# Patient Record
Sex: Female | Born: 1985 | Race: White | Hispanic: Yes | Marital: Married | Smoking: Never smoker
Health system: Southern US, Community
[De-identification: ages and names within clinical notes are randomized; demographics above are authoritative.]

## PROBLEM LIST (undated history)

## (undated) ENCOUNTER — Inpatient Hospital Stay (HOSPITAL_COMMUNITY): Payer: Self-pay

## (undated) DIAGNOSIS — Z789 Other specified health status: Secondary | ICD-10-CM

## (undated) DIAGNOSIS — I959 Hypotension, unspecified: Secondary | ICD-10-CM

## (undated) DIAGNOSIS — O2340 Unspecified infection of urinary tract in pregnancy, unspecified trimester: Secondary | ICD-10-CM

## (undated) HISTORY — PX: INTRAUTERINE DEVICE INSERTION: SHX323

## (undated) HISTORY — PX: NO PAST SURGERIES: SHX2092

---

## 2013-02-07 ENCOUNTER — Inpatient Hospital Stay (HOSPITAL_COMMUNITY)
Admission: AD | Admit: 2013-02-07 | Discharge: 2013-02-07 | Disposition: A | Discharge status: Home / Self Care | Payer: Federal, State, Local not specified - PPO | Source: Ambulatory Visit | Attending: Obstetrics & Gynecology | Admitting: Obstetrics & Gynecology

## 2013-02-07 DIAGNOSIS — N939 Abnormal uterine and vaginal bleeding, unspecified: Secondary | ICD-10-CM

## 2013-02-07 DIAGNOSIS — Z3202 Encounter for pregnancy test, result negative: Secondary | ICD-10-CM | POA: Insufficient documentation

## 2013-02-07 DIAGNOSIS — N898 Other specified noninflammatory disorders of vagina: Secondary | ICD-10-CM | POA: Insufficient documentation

## 2013-02-07 LAB — URINALYSIS, ROUTINE W REFLEX MICROSCOPIC
Bilirubin Urine: NEGATIVE
Glucose, UA: NEGATIVE mg/dL
Ketones, ur: NEGATIVE mg/dL
Protein, ur: NEGATIVE mg/dL
Urobilinogen, UA: 0.2 mg/dL (ref 0.0–1.0)

## 2013-02-07 LAB — CBC
HCT: 37.3 % (ref 36.0–46.0)
Hemoglobin: 12.6 g/dL (ref 12.0–15.0)
RBC: 4.6 MIL/uL (ref 3.87–5.11)
WBC: 5.7 10*3/uL (ref 4.0–10.5)

## 2013-02-07 LAB — URINE MICROSCOPIC-ADD ON

## 2013-02-07 LAB — WET PREP, GENITAL
Clue Cells Wet Prep HPF POC: NONE SEEN
Trich, Wet Prep: NONE SEEN
Yeast Wet Prep HPF POC: NONE SEEN

## 2013-02-07 NOTE — MAU Note (Signed)
Pt stated had a positive pregnancy test four weeks ago.  Noticed small amount of old blood on Friday and took another pregnancy test that was negative.  Today states that the bleeding is heavier and brighter and that she has had cramping as well.

## 2013-02-07 NOTE — MAU Provider Note (Signed)
History     CSN: [id number]  Arrival date and time: 02/07/13 1052   First Provider Initiated Contact with Patient 02/07/13 1217      Chief Complaint  Patient presents with  . Vaginal Bleeding   HPI  [name] is a 27 y.o. female G59P0 non-pregnant female who presents for vaginal bleeding and possible miscarriage. The patient took two pregnancy tests four weeks ago and then started bleeding three weeks ago and it stopped. She started bleeding again this past Friday and took a pregnancy test at that time which was negative. LMP was two months ago; menstrual cycles are irregular.  Pt is currently bleeding, says she has worn one panty liner today. She had a miscarraige in the past and it was a similar situation; positive pregnancy test at home, never seen by a Dr.  She currently rates her pain 2/10.    OB History   No data available      No past medical history on file.  No past surgical history on file.  No family history on file.  History  Substance Use Topics  . Smoking status: Not on file  . Smokeless tobacco: Not on file  . Alcohol Use: Not on file    Allergies: No Known Allergies  Prescriptions prior to admission  Medication Sig Dispense Refill  . Docosahexaenoic Acid (PRENATAL DHA PO) Take 1 tablet by mouth daily.       Results for orders placed during the hospital encounter of 02/07/13 (from the past 24 hour(s))  URINALYSIS, ROUTINE W REFLEX MICROSCOPIC     Status: Abnormal   Collection Time    02/07/13 11:05 AM      Result Value Range   Color, Urine YELLOW  YELLOW   APPearance CLEAR  CLEAR   Specific Gravity, Urine <1.005 (*) 1.005 - 1.030   pH 6.0  5.0 - 8.0   Glucose, UA NEGATIVE  NEGATIVE mg/dL   Hgb urine dipstick LARGE (*) NEGATIVE   Bilirubin Urine NEGATIVE  NEGATIVE   Ketones, ur NEGATIVE  NEGATIVE mg/dL   Protein, ur NEGATIVE  NEGATIVE mg/dL   Urobilinogen, UA 0.2  0.0 - 1.0 mg/dL   Nitrite NEGATIVE  NEGATIVE   Leukocytes, UA NEGATIVE   NEGATIVE  URINE MICROSCOPIC-ADD ON     Status: None   Collection Time    02/07/13 11:05 AM      Result Value Range   Squamous Epithelial / LPF RARE  RARE   RBC / HPF TOO NUMEROUS TO COUNT  <3 RBC/hpf   Bacteria, UA RARE  RARE   Urine-Other MUCOUS PRESENT    HCG, QUANTITATIVE, PREGNANCY     Status: None   Collection Time    02/07/13 11:29 AM      Result Value Range   hCG, Beta Chain, Quant, S <1  <5 mIU/mL  CBC     Status: None   Collection Time    02/07/13 11:29 AM      Result Value Range   WBC 5.7  4.0 - 10.5 K/uL   RBC 4.60  3.87 - 5.11 MIL/uL   Hemoglobin 12.6  12.0 - 15.0 g/dL   HCT 78.2  95.6 - 21.3 %   MCV 81.1  78.0 - 100.0 fL   MCH 27.4  26.0 - 34.0 pg   MCHC 33.8  30.0 - 36.0 g/dL   RDW 08.6  57.8 - 46.9 %   Platelets 285  150 - 400 K/uL  WET PREP, GENITAL  Status: Abnormal   Collection Time    02/07/13 12:39 PM      Result Value Range   Yeast Wet Prep HPF POC NONE SEEN  NONE SEEN   Trich, Wet Prep NONE SEEN  NONE SEEN   Clue Cells Wet Prep HPF POC NONE SEEN  NONE SEEN   WBC, Wet Prep HPF POC FEW (*) NONE SEEN      Review of Systems  Constitutional: Positive for chills.  Gastrointestinal: Positive for nausea and abdominal pain. Negative for vomiting, diarrhea and constipation.  Genitourinary: Negative for dysuria, urgency, frequency and hematuria.       No vaginal discharge. + vaginal bleeding. No dysuria.    Physical Exam   Blood pressure 125/69, pulse 74, temperature 98.8 F (37.1 C), temperature source Oral, resp. rate 18, height 5\' 6"  (1.676 m).  Physical Exam  Constitutional: She is oriented to person, place, and time. She appears well-developed and well-nourished. No distress.  Eyes: Pupils are equal, round, and reactive to light.  Neck: Normal range of motion.  Respiratory: Effort normal.  GI: Soft. She exhibits no distension. There is no tenderness. There is no rebound and no guarding.  Genitourinary:  Speculum exam: Vagina - Small  amount of dark red vaginal blood in the vaginal canal. no odor Cervix - Moderate contact bleeding; I used two fox swabs to clear the blood from the vagina in order to visualize the cervix.  Bimanual exam: Cervix closed Uterus non tender, normal size Adnexa non tender, no masses bilaterally GC/Chlam, wet prep done Chaperone present for exam.   Neurological: She is alert and oriented to person, place, and time.  Skin: Skin is warm and dry. She is not diaphoretic.    MAU Course  Procedures None  MDM UA UPT Wet prep GC/Chlamydia Beta Hcg level.   Assessment and Plan   A: 1. Abnormal vaginal bleeding   2. Encounter for pregnancy test with result negative    P: Discharge home This bleeding is likely your menstrual cycle; if you bleed longer than 10 days return to MAU or call the HD to schedule a follow up appointment Bleeding precautions discussed Ok to take ibuprofen as needed, as directed on the bottle.  Support given  Venia Carbon IRENE FNP-C 02/07/2013, 5:47 PM

## 2013-02-07 NOTE — MAU Provider Note (Signed)
Attestation of Attending Supervision of Advanced Practitioner (CNM/NP): Evaluation and management procedures were performed by the Advanced Practitioner under my supervision and collaboration.  I have reviewed the Advanced Practitioner's note and chart, and I agree with the management and plan.  HARRAWAY-SMITH, Freeman Borba 11:05 PM     

## 2013-02-08 LAB — GC/CHLAMYDIA PROBE AMP
CT Probe RNA: NEGATIVE
GC Probe RNA: NEGATIVE

## 2013-04-08 NOTE — L&D Delivery Note (Signed)
Delivery Note At 10:29 PM a viable female was delivered via  (Presentation: ;  ).  APGAR: , ; weight .   Placenta status: , .  Cord:  with the following complications: .  Cord pH: not done  Anesthesia:   Episiotomy:  Lacerations:  Suture Repair: 2.0 vicryl Est. Blood Loss (mL):   Mom to postpartum.  Baby to Couplet care / Skin to Skin.  MARSHALL,BERNARD A 12/09/2013, 10:45 PM

## 2013-06-22 ENCOUNTER — Encounter (HOSPITAL_COMMUNITY): Payer: Self-pay | Admitting: *Deleted

## 2013-06-22 ENCOUNTER — Inpatient Hospital Stay (HOSPITAL_COMMUNITY)
Admission: AD | Admit: 2013-06-22 | Discharge: 2013-06-22 | Disposition: A | Discharge status: Home / Self Care | Payer: Medicaid Other | Source: Ambulatory Visit | Attending: Family Medicine | Admitting: Family Medicine

## 2013-06-22 DIAGNOSIS — O0930 Supervision of pregnancy with insufficient antenatal care, unspecified trimester: Secondary | ICD-10-CM | POA: Insufficient documentation

## 2013-06-22 DIAGNOSIS — R42 Dizziness and giddiness: Secondary | ICD-10-CM | POA: Insufficient documentation

## 2013-06-22 DIAGNOSIS — O9989 Other specified diseases and conditions complicating pregnancy, childbirth and the puerperium: Principal | ICD-10-CM

## 2013-06-22 DIAGNOSIS — O99891 Other specified diseases and conditions complicating pregnancy: Secondary | ICD-10-CM | POA: Insufficient documentation

## 2013-06-22 HISTORY — DX: Hypotension, unspecified: I95.9

## 2013-06-22 LAB — COMPREHENSIVE METABOLIC PANEL
ALT: 13 U/L (ref 0–35)
AST: 13 U/L (ref 0–37)
Albumin: 2.8 g/dL — ABNORMAL LOW (ref 3.5–5.2)
Alkaline Phosphatase: 49 U/L (ref 39–117)
BUN: 6 mg/dL (ref 6–23)
CO2: 23 mEq/L (ref 19–32)
Calcium: 9 mg/dL (ref 8.4–10.5)
Chloride: 104 mEq/L (ref 96–112)
Creatinine, Ser: 0.47 mg/dL — ABNORMAL LOW (ref 0.50–1.10)
GFR calc Af Amer: >90 mL/min (ref >90)
GFR calc non Af Amer: >90 mL/min (ref >90)
GLUCOSE: 101 mg/dL — AB (ref 70–99)
POTASSIUM: 4.2 meq/L (ref 3.7–5.3)
SODIUM: 138 meq/L (ref 137–147)
TOTAL PROTEIN: 6.3 g/dL (ref 6.0–8.3)
Total Bilirubin: <0.2 mg/dL — ABNORMAL LOW (ref 0.3–1.2)

## 2013-06-22 LAB — URINALYSIS, ROUTINE W REFLEX MICROSCOPIC
Bilirubin Urine: NEGATIVE
GLUCOSE, UA: NEGATIVE mg/dL
Hgb urine dipstick: NEGATIVE
Ketones, ur: NEGATIVE mg/dL
LEUKOCYTES UA: NEGATIVE
Nitrite: NEGATIVE
PH: 6.5 (ref 5.0–8.0)
Protein, ur: NEGATIVE mg/dL
SPECIFIC GRAVITY, URINE: 1.025 (ref 1.005–1.030)
Urobilinogen, UA: 0.2 mg/dL (ref 0.0–1.0)

## 2013-06-22 LAB — CBC
HCT: 34.3 % — ABNORMAL LOW (ref 36.0–46.0)
HEMOGLOBIN: 11.8 g/dL — AB (ref 12.0–15.0)
MCH: 29 pg (ref 26.0–34.0)
MCHC: 34.4 g/dL (ref 30.0–36.0)
MCV: 84.3 fL (ref 78.0–100.0)
Platelets: 223 10*3/uL (ref 150–400)
RBC: 4.07 MIL/uL (ref 3.87–5.11)
RDW: 13.5 % (ref 11.5–15.5)
WBC: 7.8 10*3/uL (ref 4.0–10.5)

## 2013-06-22 LAB — POCT PREGNANCY, URINE: PREG TEST UR: POSITIVE — AB

## 2013-06-22 NOTE — Progress Notes (Signed)
Vieno Tarrant was placed in [place] in error. There is a fetal monitor tracing in this medical record that belongs to Rocky Morel MRN [id number].

## 2013-06-22 NOTE — Discharge Instructions (Signed)

## 2013-06-22 NOTE — MAU Provider Note (Signed)
Attestation of Attending Supervision of Advanced Practitioner (PA/CNM/NP): Evaluation and management procedures were performed by the Advanced Practitioner under my supervision and collaboration.  I have reviewed the Advanced Practitioner's note and chart, and I agree with the management and plan.  Donnamae Jude, MD [facility] Attending 06/22/2013 1:37 PM

## 2013-06-22 NOTE — MAU Provider Note (Signed)
History     CSN: [id number]  Arrival date and time: 06/22/13 0935   None     Chief Complaint  Patient presents with  . Possible Pregnancy  . Dizziness   HPI Pt is a 28 yo G3P0020 currently pregnant female w/ a PMH of miscarriage and hypotension presenting with a recent episode of dizziness (pt feels like the room is spinning) . Pt states that she has a long-standing hx of hypotension which causes constant lightheadedness and fatigue. Pt states that yesterday while sitting at her office, she began to experience an episode of the room spinning. Other associated symptoms with this was nausea but no vomiting. She did not experience a LOC. Pt stated that she thought she was going to experience another episode at work today, so decided to come over for a check-up. The patient has a high stress job and recently had an increase in the number of hours she has to work.  Pt stays adequately hydrated. She otherwise feels at her baseline with no new symptoms.  Pt states that she thinks she may have experienced her baby "flutter" for the first time on Saturday. She has had no contractions, and is experiencing no bleeding or fluid leakage.   Past Medical History  Diagnosis Date  . Hypotension     History reviewed. No pertinent past surgical history.  History reviewed. No pertinent family history.  History  Substance Use Topics  . Smoking status: Never Smoker   . Smokeless tobacco: Never Used  . Alcohol Use: No    Allergies: No Known Allergies  Prescriptions prior to admission  Medication Sig Dispense Refill  . Docosahexaenoic Acid (PRENATAL DHA PO) Take 1 tablet by mouth daily.       Results for orders placed during the hospital encounter of 06/22/13 (from the past 48 hour(s))  URINALYSIS, ROUTINE W REFLEX MICROSCOPIC     Status: None   Collection Time    06/22/13  9:45 AM      Result Value Ref Range   Color, Urine YELLOW  YELLOW   APPearance CLEAR  CLEAR   Specific Gravity, Urine  1.025  1.005 - 1.030   pH 6.5  5.0 - 8.0   Glucose, UA NEGATIVE  NEGATIVE mg/dL   Hgb urine dipstick NEGATIVE  NEGATIVE   Bilirubin Urine NEGATIVE  NEGATIVE   Ketones, ur NEGATIVE  NEGATIVE mg/dL   Protein, ur NEGATIVE  NEGATIVE mg/dL   Urobilinogen, UA 0.2  0.0 - 1.0 mg/dL   Nitrite NEGATIVE  NEGATIVE   Leukocytes, UA NEGATIVE  NEGATIVE   Comment: MICROSCOPIC NOT DONE ON URINES WITH NEGATIVE PROTEIN, BLOOD, LEUKOCYTES, NITRITE, OR GLUCOSE <1000 mg/dL.  POCT PREGNANCY, URINE     Status: Abnormal   Collection Time    06/22/13  9:48 AM      Result Value Ref Range   Preg Test, Ur POSITIVE (*) NEGATIVE   Comment:            THE SENSITIVITY OF THIS     METHODOLOGY IS >24 mIU/mL  CBC     Status: Abnormal   Collection Time    06/22/13 10:31 AM      Result Value Ref Range   WBC 7.8  4.0 - 10.5 K/uL   RBC 4.07  3.87 - 5.11 MIL/uL   Hemoglobin 11.8 (*) 12.0 - 15.0 g/dL   HCT 34.3 (*) 36.0 - 46.0 %   MCV 84.3  78.0 - 100.0 fL   MCH 29.0  26.0 -  34.0 pg   MCHC 34.4  30.0 - 36.0 g/dL   RDW 13.5  11.5 - 15.5 %   Platelets 223  150 - 400 K/uL  COMPREHENSIVE METABOLIC PANEL     Status: Abnormal   Collection Time    06/22/13 10:31 AM      Result Value Ref Range   Sodium 138  137 - 147 mEq/L   Potassium 4.2  3.7 - 5.3 mEq/L   Chloride 104  96 - 112 mEq/L   CO2 23  19 - 32 mEq/L   Glucose, Bld 101 (*) 70 - 99 mg/dL   BUN 6  6 - 23 mg/dL   Creatinine, Ser 0.47 (*) 0.50 - 1.10 mg/dL   Calcium 9.0  8.4 - 10.5 mg/dL   Total Protein 6.3  6.0 - 8.3 g/dL   Albumin 2.8 (*) 3.5 - 5.2 g/dL   AST 13  0 - 37 U/L   ALT 13  0 - 35 U/L   Alkaline Phosphatase 49  39 - 117 U/L   Total Bilirubin <0.2 (*) 0.3 - 1.2 mg/dL   Comment: REPEATED TO VERIFY   GFR calc non Af Amer >90  >90 mL/min   GFR calc Af Amer >90  >90 mL/min   Comment: (NOTE)     The eGFR has been calculated using the CKD EPI equation.     This calculation has not been validated in all clinical situations.     eGFR's persistently <90  mL/min signify possible Chronic Kidney     Disease.    Review of Systems  Constitutional: Positive for malaise/fatigue. Negative for fever and chills.  HENT: Negative for congestion, ear pain and sore throat.   Eyes: Negative for blurred vision.  Respiratory: Negative for shortness of breath.   Cardiovascular: Negative for chest pain.  Gastrointestinal: Negative for nausea, vomiting, abdominal pain, diarrhea and constipation.  Genitourinary: Negative for dysuria, urgency and frequency.  Musculoskeletal: Negative for myalgias.  Neurological: Positive for dizziness. Negative for headaches.   Physical Exam   Blood pressure 110/68, pulse 91, temperature 98 F (36.7 C), temperature source Oral, resp. rate 18, height 5' 5.75" (1.67 m), weight 102.785 kg (226 lb 9.6 oz), last menstrual period 03/03/2013.  Physical Exam  Constitutional: She is oriented to person, place, and time. Vital signs are normal. She appears well-developed and well-nourished.  Non-toxic appearance. She does not have a sickly appearance. She does not appear ill. No distress.  HENT:  Head: Normocephalic.  Eyes: Pupils are equal, round, and reactive to light.  Neck: Neck supple.  Cardiovascular: Normal rate and regular rhythm.   Respiratory: Effort normal and breath sounds normal.  GI: Soft. There is no tenderness. There is no rebound and no guarding.  Musculoskeletal: Normal range of motion.  Neurological: She is alert and oriented to person, place, and time.  Skin: Skin is warm. She is not diaphoretic.    Fetal Heart Rate A Mode Doppler Baseline Rate (A) 158 bpm  MAU Course  Procedures None  MDM Normal orthostatic vital signs CBC CMET UA A list of safe medication options to use in pregnancy   Assessment and Plan  #Vertigo: This is likely a transient episode. No s/s of anemia as the cause or of significant metabolic imbalance. She will f/u with PCP regarding this.   A:  Dizziness in pregnancy  No  prenatal care   P:  Discharge home in stable condition Referral to the clinic sent  Return to MAU if  symptoms worsen Eat small, frequent meals throughout the day If dizziness occurs while driving, pull over right away   Jeanett Schlein T 06/22/2013, 10:02 AM   Evaluation and management procedures were performed by the PA student under my supervision and collaboration. I have reviewed the note and chart, and I agree with the management and plan.  Darrelyn Hillock Rasch, NP 06/22/2013 1:11 PM

## 2013-06-30 ENCOUNTER — Encounter: Payer: Self-pay | Admitting: Advanced Practice Midwife

## 2013-07-23 ENCOUNTER — Encounter: Payer: Self-pay | Admitting: Advanced Practice Midwife

## 2013-07-23 ENCOUNTER — Ambulatory Visit (INDEPENDENT_AMBULATORY_CARE_PROVIDER_SITE_OTHER): Payer: Medicaid Other | Admitting: Advanced Practice Midwife

## 2013-07-23 VITALS — BP 109/75 | Temp 98.6°F | Wt 221.0 lb

## 2013-07-23 DIAGNOSIS — Z3400 Encounter for supervision of normal first pregnancy, unspecified trimester: Secondary | ICD-10-CM

## 2013-07-23 LAB — POCT URINALYSIS DIPSTICK
BILIRUBIN UA: NEGATIVE
Blood, UA: NEGATIVE
Glucose, UA: NEGATIVE
Leukocytes, UA: NEGATIVE
Nitrite, UA: NEGATIVE
Spec Grav, UA: 1.02
Urobilinogen, UA: NEGATIVE
pH, UA: 5

## 2013-07-23 NOTE — Progress Notes (Signed)
Pulse: 81 Patient is in the office today for her Initial OB visit. Patient states she is having pelvic pressure. Patient states she would like to know when she can get her ultrasound scheduled. Patient states last Pap smear was in March 2014, results were normal.

## 2013-07-23 NOTE — Progress Notes (Signed)
   Subjective:    Dawn Benson is a [id number] [redacted]w[redacted]d being seen today for her first obstetrical visit.  Her obstetrical history is significant for obesity. Patient does intend to breast feed. Pregnancy history fully reviewed.  Patient reports no complaints.  Constitutional: negative for fatigue and weight loss Respiratory: negative for cough and wheezing Cardiovascular: negative for chest pain, fatigue and palpitations Gastrointestinal: negative for abdominal pain and change in bowel habits Genitourinary:negative Integument/breast: negative for nipple discharge Musculoskeletal:negative for myalgias Neurological: negative for gait problems and tremors Behavioral/Psych: negative for abusive relationship, depression Endocrine: negative for temperature intolerance      Filed Vitals:   07/23/13 1546  BP: 109/75  Temp: 98.6 F (37 C)  Weight: 221 lb (100.245 kg)    HISTORY: OB History  Gravida Para Term Preterm AB SAB TAB Ectopic Multiple Living  3    2 2         # Outcome Date GA Lbr Len/2nd Weight Sex Delivery Anes PTL Lv  3 CUR           2 SAB 2014 [redacted]w[redacted]d       N  1 SAB 2010 [redacted]w[redacted]d       N     Past Medical History  Diagnosis Date  . Hypotension    History reviewed. No pertinent past surgical history. Family History  Problem Relation Age of Onset  . Fibroids Mother      Exam   Filed Vitals:   07/23/13 1546  BP: 109/75  Temp: 98.6 F (37 C)   Filed Vitals:   07/23/13 1546  BP: 109/75  Temp: 98.6 F (37 C)   FHR 140  Uterus:  Fundal Height: 20 cm  Pelvic Exam: deferred                              System:     Skin: normal coloration and turgor, no rashes    Neurologic: oriented, normal   Extremities: normal strength, tone, and muscle mass   HEENT PERRLA   Mouth/Teeth mucous membranes moist, pharynx normal without lesions   Neck supple   Cardiovascular: regular rate and rhythm   Respiratory:  appears well, vitals normal, no respiratory distress,  acyanotic, normal RR, ear and throat exam is normal, neck free of mass or lymphadenopathy, chest clear, no wheezing, crepitations, rhonchi, normal symmetric air entry   Abdomen: soft, non-tender; bowel sounds normal; no masses,  no organomegaly          Assessment:    Pregnancy: [id number] Patient Active Problem List   Diagnosis Date Noted  . Supervision of normal first pregnancy 07/23/2013  Elevated BMI      Plan:     Initial labs drawn. Prenatal vitamins. Problem list reviewed and updated. Genetic Screening discussed Quad Screen: declined.  Ultrasound discussed; fetal survey: requested.  Follow up in 4 weeks. Discussed diet and weight gain in pregnancy. 80% of 45 min visit spent on counseling and coordination of care.     Aleatha Taite Thereasa Parkin 07/23/2013

## 2013-07-24 LAB — OBSTETRIC PANEL
Antibody Screen: NEGATIVE
BASOS PCT: 0 % (ref 0–1)
Basophils Absolute: 0 10*3/uL (ref 0.0–0.1)
Eosinophils Absolute: 0 10*3/uL (ref 0.0–0.7)
Eosinophils Relative: 0 % (ref 0–5)
HCT: 34.4 % — ABNORMAL LOW (ref 36.0–46.0)
HEMOGLOBIN: 11.6 g/dL — AB (ref 12.0–15.0)
HEP B S AG: NEGATIVE
Lymphocytes Relative: 14 % (ref 12–46)
Lymphs Abs: 1.2 10*3/uL (ref 0.7–4.0)
MCH: 28.1 pg (ref 26.0–34.0)
MCHC: 33.7 g/dL (ref 30.0–36.0)
MCV: 83.3 fL (ref 78.0–100.0)
Monocytes Absolute: 0.5 10*3/uL (ref 0.1–1.0)
Monocytes Relative: 6 % (ref 3–12)
Neutro Abs: 6.8 10*3/uL (ref 1.7–7.7)
Neutrophils Relative %: 80 % — ABNORMAL HIGH (ref 43–77)
Platelets: 257 10*3/uL (ref 150–400)
RBC: 4.13 MIL/uL (ref 3.87–5.11)
RDW: 14.1 % (ref 11.5–15.5)
RUBELLA: 1.73 {index} — AB (ref <0.90)
Rh Type: POSITIVE
WBC: 8.5 10*3/uL (ref 4.0–10.5)

## 2013-07-24 LAB — VARICELLA ZOSTER ANTIBODY, IGG: Varicella IgG: 41.54 Index (ref <135.00)

## 2013-07-24 LAB — HIV ANTIBODY (ROUTINE TESTING W REFLEX): HIV: NONREACTIVE

## 2013-07-24 LAB — VITAMIN D 25 HYDROXY (VIT D DEFICIENCY, FRACTURES): VIT D 25 HYDROXY: 34 ng/mL (ref 30–89)

## 2013-07-24 LAB — TSH: TSH: 0.832 u[IU]/mL (ref 0.350–4.500)

## 2013-07-25 LAB — CULTURE, OB URINE

## 2013-07-27 LAB — HEMOGLOBINOPATHY EVALUATION
HEMOGLOBIN OTHER: 0 %
HGB F QUANT: 0 % (ref 0.0–2.0)
HGB S QUANTITAION: 0 %
Hgb A2 Quant: 2.8 % (ref 2.2–3.2)
Hgb A: 97.2 % (ref 96.8–97.8)

## 2013-07-28 ENCOUNTER — Encounter: Payer: Federal, State, Local not specified - PPO | Admitting: Advanced Practice Midwife

## 2013-07-30 ENCOUNTER — Ambulatory Visit (HOSPITAL_COMMUNITY)
Admission: RE | Admit: 2013-07-30 | Discharge: 2013-07-30 | Disposition: A | Discharge status: Home / Self Care | Payer: Medicaid Other | Source: Ambulatory Visit | Attending: Advanced Practice Midwife | Admitting: Advanced Practice Midwife

## 2013-07-30 DIAGNOSIS — Z3400 Encounter for supervision of normal first pregnancy, unspecified trimester: Secondary | ICD-10-CM

## 2013-07-30 DIAGNOSIS — Z3689 Encounter for other specified antenatal screening: Secondary | ICD-10-CM | POA: Insufficient documentation

## 2013-08-20 ENCOUNTER — Other Ambulatory Visit: Payer: Medicaid Other

## 2013-08-20 ENCOUNTER — Encounter: Payer: Self-pay | Admitting: Advanced Practice Midwife

## 2013-08-20 ENCOUNTER — Ambulatory Visit (INDEPENDENT_AMBULATORY_CARE_PROVIDER_SITE_OTHER): Payer: Medicaid Other | Admitting: Advanced Practice Midwife

## 2013-08-20 VITALS — BP 125/80 | HR 79 | Temp 98.2°F | Wt 220.0 lb

## 2013-08-20 DIAGNOSIS — Z3400 Encounter for supervision of normal first pregnancy, unspecified trimester: Secondary | ICD-10-CM

## 2013-08-20 LAB — CBC
HEMATOCRIT: 35.1 % — AB (ref 36.0–46.0)
Hemoglobin: 11.7 g/dL — ABNORMAL LOW (ref 12.0–15.0)
MCH: 28.2 pg (ref 26.0–34.0)
MCHC: 33.3 g/dL (ref 30.0–36.0)
MCV: 84.6 fL (ref 78.0–100.0)
Platelets: 238 10*3/uL (ref 150–400)
RBC: 4.15 MIL/uL (ref 3.87–5.11)
RDW: 14.2 % (ref 11.5–15.5)
WBC: 9.3 10*3/uL (ref 4.0–10.5)

## 2013-08-20 LAB — POCT URINALYSIS DIPSTICK
BILIRUBIN UA: NEGATIVE
Blood, UA: NEGATIVE
GLUCOSE UA: NEGATIVE
Ketones, UA: NEGATIVE
Nitrite, UA: POSITIVE
Protein, UA: NEGATIVE
SPEC GRAV UA: 1.01
UROBILINOGEN UA: NEGATIVE
pH, UA: 6

## 2013-08-20 LAB — OB RESULTS CONSOLE GC/CHLAMYDIA
Chlamydia: NEGATIVE
GC PROBE AMP, GENITAL: NEGATIVE

## 2013-08-20 NOTE — Progress Notes (Signed)
Subjective: Dawn Benson is a 28 y.o. at 24 weeks by LMP, 21  Patient denies vaginal leaking of fluid or bleeding, denies contractions.  Reports positive fetal movment.  Denies concerns today.  Objective: Filed Vitals:   08/20/13 1022  BP: 125/80  Pulse: 79  Temp: 98.2 F (36.8 C)   150 FHR @U  Fundal Height Fetal Position NA  Assessment: Patient Active Problem List   Diagnosis Date Noted  . Supervision of normal first pregnancy 07/23/2013    Plan: Patient to return to clinic in 4 weeks Glucose test today Repeat US scheduled Review BCM NV Reviewed warning signs in pregnancy. Patient to call with concerns PRN. Reviewed triage location. Urine GC/CT today UA abnormal, culture pending Orders Placed This Encounter  Procedures  . GC/Chlamydia Probe Amp  . Urine culture  . Glucose Tolerance, 2 Hours w/1 Hour  . CBC  . HIV antibody  . RPR  . POCT urinalysis dipstick   Oriyah Lamphear Roni Bread CNM

## 2013-08-21 LAB — GC/CHLAMYDIA PROBE AMP
CT PROBE, AMP APTIMA: NEGATIVE
GC PROBE AMP APTIMA: NEGATIVE

## 2013-08-21 LAB — GLUCOSE TOLERANCE, 2 HOURS W/ 1HR
GLUCOSE, 2 HOUR: 98 mg/dL (ref 70–139)
GLUCOSE: 102 mg/dL (ref 70–170)
Glucose, Fasting: 54 mg/dL — ABNORMAL LOW (ref 70–99)

## 2013-08-21 LAB — HIV ANTIBODY (ROUTINE TESTING W REFLEX): HIV 1&2 Ab, 4th Generation: NONREACTIVE

## 2013-08-21 LAB — RPR

## 2013-08-23 LAB — URINE CULTURE: Colony Count: >=100000

## 2013-08-24 ENCOUNTER — Other Ambulatory Visit: Payer: Self-pay | Admitting: Advanced Practice Midwife

## 2013-08-24 ENCOUNTER — Other Ambulatory Visit: Payer: Self-pay | Admitting: *Deleted

## 2013-08-24 DIAGNOSIS — Z1389 Encounter for screening for other disorder: Secondary | ICD-10-CM

## 2013-08-24 DIAGNOSIS — N390 Urinary tract infection, site not specified: Secondary | ICD-10-CM | POA: Insufficient documentation

## 2013-08-24 MED ORDER — NITROFURANTOIN MONOHYD MACRO 100 MG PO CAPS: 100.0000 mg | ORAL_CAPSULE | Freq: Two times a day (BID) | ORAL | Status: DC

## 2013-08-25 ENCOUNTER — Telehealth: Payer: Self-pay | Admitting: *Deleted

## 2013-08-25 NOTE — Telephone Encounter (Signed)
Patient was expecting her Rx at the pharmacy- it was not there when she called to check if it was ready. Per computer- Rx was sent. Call to pharmacy to verify receipt- they did not fill because the patient had never been there. They will call patient to get her info and fill her Rx.

## 2013-08-31 ENCOUNTER — Other Ambulatory Visit: Payer: Medicaid Other

## 2013-08-31 ENCOUNTER — Ambulatory Visit (INDEPENDENT_AMBULATORY_CARE_PROVIDER_SITE_OTHER): Payer: Medicaid Other

## 2013-08-31 DIAGNOSIS — Z1389 Encounter for screening for other disorder: Secondary | ICD-10-CM

## 2013-08-31 DIAGNOSIS — O365990 Maternal care for other known or suspected poor fetal growth, unspecified trimester, not applicable or unspecified: Secondary | ICD-10-CM

## 2013-08-31 LAB — US OB FOLLOW UP

## 2013-09-08 ENCOUNTER — Encounter: Payer: Self-pay | Admitting: *Deleted

## 2013-09-11 ENCOUNTER — Encounter (HOSPITAL_COMMUNITY): Payer: Self-pay

## 2013-09-11 ENCOUNTER — Inpatient Hospital Stay (HOSPITAL_COMMUNITY)
Admission: AD | Admit: 2013-09-11 | Discharge: 2013-09-12 | Disposition: A | Discharge status: Home / Self Care | Payer: Medicaid Other | Source: Ambulatory Visit | Attending: Obstetrics | Admitting: Obstetrics

## 2013-09-11 DIAGNOSIS — M545 Low back pain, unspecified: Secondary | ICD-10-CM | POA: Insufficient documentation

## 2013-09-11 DIAGNOSIS — M5387 Other specified dorsopathies, lumbosacral region: Secondary | ICD-10-CM

## 2013-09-11 DIAGNOSIS — Z3400 Encounter for supervision of normal first pregnancy, unspecified trimester: Secondary | ICD-10-CM

## 2013-09-11 DIAGNOSIS — O9989 Other specified diseases and conditions complicating pregnancy, childbirth and the puerperium: Principal | ICD-10-CM

## 2013-09-11 DIAGNOSIS — N390 Urinary tract infection, site not specified: Secondary | ICD-10-CM

## 2013-09-11 DIAGNOSIS — O99891 Other specified diseases and conditions complicating pregnancy: Secondary | ICD-10-CM | POA: Insufficient documentation

## 2013-09-11 HISTORY — DX: Unspecified infection of urinary tract in pregnancy, unspecified trimester: O23.40

## 2013-09-11 LAB — URINALYSIS, ROUTINE W REFLEX MICROSCOPIC
Bilirubin Urine: NEGATIVE
Glucose, UA: NEGATIVE mg/dL
HGB URINE DIPSTICK: NEGATIVE
Ketones, ur: 15 mg/dL — AB
Leukocytes, UA: NEGATIVE
Nitrite: NEGATIVE
PROTEIN: NEGATIVE mg/dL
SPECIFIC GRAVITY, URINE: 1.02 (ref 1.005–1.030)
Urobilinogen, UA: 0.2 mg/dL (ref 0.0–1.0)
pH: 6.5 (ref 5.0–8.0)

## 2013-09-11 MED ORDER — CYCLOBENZAPRINE HCL 10 MG PO TABS
10.0000 mg | ORAL_TABLET | Freq: Once | ORAL | Status: AC
  Administered 2013-09-11: 10 mg via ORAL
  Filled 2013-09-11: qty 1

## 2013-09-11 MED ORDER — OXYCODONE-ACETAMINOPHEN 5-325 MG PO TABS
2.0000 | ORAL_TABLET | Freq: Once | ORAL | Status: AC
  Administered 2013-09-12: 2 via ORAL
  Filled 2013-09-11: qty 2

## 2013-09-11 NOTE — MAU Note (Signed)
Lower back pain started on Wednesday.  No bleeding and no leaking .  Baby  Moving well per pt.  Reports had UTI 3 weeks ago and wanted to see if it had returned.

## 2013-09-11 NOTE — MAU Provider Note (Signed)
History     CSN: [id number]  Arrival date and time: 09/11/13 2221   None     Chief Complaint  Patient presents with  . Back Pain   Back Pain Pertinent negatives include no abdominal pain, dysuria or fever.   This is a 28 y.o. female at [redacted]w[redacted]d who presents with c/o low back pain for several days. Denies contractions. Pain is in SI joints but radiated laterally all across. Good FM. Denies leaking or bleeding.   RN Note:  Lower back pain started on Wednesday. No bleeding and no leaking . Baby Moving well per pt. Reports had UTI 3 weeks ago and wanted to see if it had returned.       OB History   Grav Para Term Preterm Abortions TAB SAB Ect Mult Living   3    2  2          Past Medical History  Diagnosis Date  . Hypotension   . UTI in pregnancy     Past Surgical History  Procedure Laterality Date  . Intrauterine device insertion      Family History  Problem Relation Age of Onset  . Fibroids Mother   . Hypertension Mother   . Hypertension Father     History  Substance Use Topics  . Smoking status: Never Smoker   . Smokeless tobacco: Never Used  . Alcohol Use: No    Allergies: No Known Allergies  Prescriptions prior to admission  Medication Sig Dispense Refill  . Docosahexaenoic Acid (PRENATAL DHA PO) Take 1 tablet by mouth daily.      . nitrofurantoin, macrocrystal-monohydrate, (MACROBID) 100 MG capsule Take 1 capsule (100 mg total) by mouth 2 (two) times daily.  14 capsule  0    Review of Systems  Constitutional: Negative for fever, chills and malaise/fatigue.  Gastrointestinal: Negative for nausea, vomiting, abdominal pain, diarrhea and constipation.  Genitourinary: Negative for dysuria and flank pain.  Musculoskeletal: Positive for back pain.   Physical Exam   Blood pressure 112/71, pulse 79, temperature 98.3 F (36.8 C), temperature source Oral, resp. rate 18, last menstrual period 03/03/2013, SpO2 98.00%.  Physical Exam  Constitutional: She is  oriented to person, place, and time. She appears well-developed and well-nourished. No distress.  Cardiovascular: Normal rate.   Respiratory: Effort normal.  GI: Soft. She exhibits no mass. There is no tenderness. There is no rebound and no guarding.  Musculoskeletal: Normal range of motion. She exhibits tenderness (bilateral sciatic areas).  Pain exacerbated with movement  Neurological: She is alert and oriented to person, place, and time.  Skin: Skin is warm and dry.  Psychiatric: She has a normal mood and affect.   FHR reassuring No contractions Cervix long/closed  MAU Course  Procedures  MDM Results for orders placed during the hospital encounter of 09/11/13 (from the past 72 hour(s))  URINALYSIS, ROUTINE W REFLEX MICROSCOPIC     Status: Abnormal   Collection Time    09/11/13 10:38 PM      Result Value Ref Range   Color, Urine YELLOW  YELLOW   APPearance CLEAR  CLEAR   Specific Gravity, Urine 1.020  1.005 - 1.030   pH 6.5  5.0 - 8.0   Glucose, UA NEGATIVE  NEGATIVE mg/dL   Hgb urine dipstick NEGATIVE  NEGATIVE   Bilirubin Urine NEGATIVE  NEGATIVE   Ketones, ur 15 (*) NEGATIVE mg/dL   Protein, ur NEGATIVE  NEGATIVE mg/dL   Urobilinogen, UA 0.2  0.0 - 1.0 mg/dL  Nitrite NEGATIVE  NEGATIVE   Leukocytes, UA NEGATIVE  NEGATIVE   Comment: MICROSCOPIC NOT DONE ON URINES WITH NEGATIVE PROTEIN, BLOOD, LEUKOCYTES, NITRITE, OR GLUCOSE <1000 mg/dL.   Flexeril given with little relief.  Will try Percocet  >> got some relief from that.  Assessment and Plan  A:  SIUP at [redacted]w[redacted]d        Low back pain, probably mix of sciatica and spasm       No PTL  P:  Discussed with pt       Rx Flexeril and Percocet        PTL precautions        Followup in office  Seabron Spates 09/11/2013, 11:13 PM

## 2013-09-12 DIAGNOSIS — M5430 Sciatica, unspecified side: Secondary | ICD-10-CM

## 2013-09-12 MED ORDER — CYCLOBENZAPRINE HCL 10 MG PO TABS: 10.0000 mg | ORAL_TABLET | Freq: Three times a day (TID) | ORAL | Status: DC | PRN

## 2013-09-12 MED ORDER — OXYCODONE-ACETAMINOPHEN 5-325 MG PO TABS: 1.0000 | ORAL_TABLET | Freq: Four times a day (QID) | ORAL | Status: DC | PRN

## 2013-09-12 NOTE — Discharge Instructions (Signed)
Back Pain, Adult  Back pain is very common. The pain often gets better over time. The cause of back pain is usually not dangerous. Most people can learn to manage their back pain on their own.   HOME CARE   · Stay active. Start with short walks on flat ground if you can. Try to walk farther each day.  · Do not sit, drive, or stand in one place for more than 30 minutes. Do not stay in bed.  · Do not avoid exercise or work. Activity can help your back heal faster.  · Be careful when you bend or lift an object. Bend at your knees, keep the object close to you, and do not twist.  · Sleep on a firm mattress. Lie on your side, and bend your knees. If you lie on your back, put a pillow under your knees.  · Only take medicines as told by your doctor.  · Put ice on the injured area.  · Put ice in a plastic bag.  · Place a towel between your skin and the bag.  · Leave the ice on for 15-20 minutes, 03-04 times a day for the first 2 to 3 days. After that, you can switch between ice and heat packs.  · Ask your doctor about back exercises or massage.  · Avoid feeling anxious or stressed. Find good ways to deal with stress, such as exercise.  GET HELP RIGHT AWAY IF:   · Your pain does not go away with rest or medicine.  · Your pain does not go away in 1 week.  · You have new problems.  · You do not feel well.  · The pain spreads into your legs.  · You cannot control when you poop (bowel movement) or pee (urinate).  · Your arms or legs feel weak or lose feeling (numbness).  · You feel sick to your stomach (nauseous) or throw up (vomit).  · You have belly (abdominal) pain.  · You feel like you may pass out (faint).  MAKE SURE YOU:   · Understand these instructions.  · Will watch your condition.  · Will get help right away if you are not doing well or get worse.  Document Released: 09/11/2007 Document Revised: 06/17/2011 Document Reviewed: 08/13/2010  [employer]® Patient Information ©2014 [employer].

## 2013-09-17 ENCOUNTER — Encounter: Payer: Medicaid Other | Admitting: Advanced Practice Midwife

## 2013-09-24 ENCOUNTER — Encounter: Payer: Medicaid Other | Admitting: Advanced Practice Midwife

## 2013-09-28 ENCOUNTER — Ambulatory Visit (INDEPENDENT_AMBULATORY_CARE_PROVIDER_SITE_OTHER): Payer: Medicaid Other | Admitting: Advanced Practice Midwife

## 2013-09-28 VITALS — BP 114/74 | HR 78 | Temp 98.0°F | Wt 225.0 lb

## 2013-09-28 DIAGNOSIS — Z3480 Encounter for supervision of other normal pregnancy, unspecified trimester: Secondary | ICD-10-CM

## 2013-09-28 DIAGNOSIS — Z3400 Encounter for supervision of normal first pregnancy, unspecified trimester: Secondary | ICD-10-CM

## 2013-09-28 NOTE — Progress Notes (Signed)
Subjective: Rhylie Stehr is a 28 y.o. at 29 weeks by LMP, 21  Patient denies vaginal leaking of fluid or bleeding, denies contractions.  Reports positive fetal movment.  Patient having muscle pain and back pain feels it is in the nerve.  Objective: Filed Vitals:   09/28/13 1549  BP: 114/74  Pulse: 78  Temp: 98 F (36.7 C)   150 FHR 29 Fundal Height Fetal Position unknown  Assessment: Patient Active Problem List   Diagnosis Date Noted  . UTI (lower urinary tract infection) 08/24/2013  . Supervision of normal first pregnancy 07/23/2013    Plan: Patient to return to clinic in 2 weeks Needs repeat US due to suboptimal views of spine, scheduled next Monday. Labs WNL Reviewed warning signs in pregnancy. Patient to call with concerns PRN. Reviewed triage location. Reviewed methods to help w/ back pain and sciatica in pregnancy. Encouraged yoga, stretching, heat/ice therapy, belly band. Reviewed other options for pain relief.  Amy Roni Bread CNM

## 2013-09-29 LAB — POCT URINALYSIS DIPSTICK
BILIRUBIN UA: NEGATIVE
Blood, UA: NEGATIVE
GLUCOSE UA: NEGATIVE
KETONES UA: NEGATIVE
Leukocytes, UA: NEGATIVE
Nitrite, UA: NEGATIVE
Protein, UA: NEGATIVE
Urobilinogen, UA: NEGATIVE
pH, UA: 5

## 2013-09-29 NOTE — Addendum Note (Signed)
Addended by: Ladona Ridgel on: 09/29/2013 01:04 PM   Modules accepted: Orders

## 2013-10-04 ENCOUNTER — Ambulatory Visit (HOSPITAL_COMMUNITY)
Admission: RE | Admit: 2013-10-04 | Discharge: 2013-10-04 | Disposition: A | Discharge status: Home / Self Care | Payer: Medicaid Other | Source: Ambulatory Visit | Attending: Advanced Practice Midwife | Admitting: Advanced Practice Midwife

## 2013-10-04 DIAGNOSIS — Z3483 Encounter for supervision of other normal pregnancy, third trimester: Secondary | ICD-10-CM

## 2013-10-04 DIAGNOSIS — Z3689 Encounter for other specified antenatal screening: Secondary | ICD-10-CM | POA: Insufficient documentation

## 2013-10-15 ENCOUNTER — Ambulatory Visit (INDEPENDENT_AMBULATORY_CARE_PROVIDER_SITE_OTHER): Payer: Medicaid Other | Admitting: Advanced Practice Midwife

## 2013-10-15 VITALS — BP 123/83 | HR 87 | Temp 97.3°F | Wt 222.0 lb

## 2013-10-15 DIAGNOSIS — Z3403 Encounter for supervision of normal first pregnancy, third trimester: Secondary | ICD-10-CM

## 2013-10-15 DIAGNOSIS — Z3400 Encounter for supervision of normal first pregnancy, unspecified trimester: Secondary | ICD-10-CM

## 2013-10-15 NOTE — Progress Notes (Signed)
Subjective: Dawn Benson is a 28 y.o. at 32 weeks by LMP  Patient denies vaginal leaking of fluid or bleeding, denies contractions.  Reports positive fetal movment.  Denies concerns today.  Objective: Filed Vitals:   10/15/13 1545  BP: 123/83  Pulse: 87  Temp: 97.3 F (36.3 C)   140 FHR 32 Fundal Height Fetal Position cephalic, confirmed by bedside US  Assessment: Patient Active Problem List   Diagnosis Date Noted  . UTI (lower urinary tract infection) 08/24/2013  . Supervision of normal first pregnancy 07/23/2013    Plan: Patient to return to clinic in 2 weeks Spine cleared on last [place], anatomy was cleared previously on other [place] report. No need for repeat at this time.  Reviewed postpartum education Give pediatrician sheet Plans [employer] PP Reviewed warning signs in pregnancy. Patient to call with concerns PRN. Reviewed triage location.   Grier Czerwinski Roni Bread CNM

## 2013-10-19 LAB — POCT URINALYSIS DIPSTICK
BILIRUBIN UA: NEGATIVE
Blood, UA: NEGATIVE
Glucose, UA: NEGATIVE
KETONES UA: NEGATIVE
LEUKOCYTES UA: NEGATIVE
Nitrite, UA: POSITIVE
Spec Grav, UA: 1.01
Urobilinogen, UA: NEGATIVE
pH, UA: 6.5

## 2013-11-01 ENCOUNTER — Ambulatory Visit (INDEPENDENT_AMBULATORY_CARE_PROVIDER_SITE_OTHER): Payer: Medicaid Other | Admitting: Obstetrics

## 2013-11-01 ENCOUNTER — Encounter: Payer: Self-pay | Admitting: Obstetrics

## 2013-11-01 VITALS — BP 120/77 | HR 73 | Temp 97.4°F | Wt 225.0 lb

## 2013-11-01 DIAGNOSIS — K219 Gastro-esophageal reflux disease without esophagitis: Secondary | ICD-10-CM

## 2013-11-01 DIAGNOSIS — Z3400 Encounter for supervision of normal first pregnancy, unspecified trimester: Secondary | ICD-10-CM

## 2013-11-01 DIAGNOSIS — Z3403 Encounter for supervision of normal first pregnancy, third trimester: Secondary | ICD-10-CM

## 2013-11-01 LAB — POCT URINALYSIS DIPSTICK
BILIRUBIN UA: NEGATIVE
Blood, UA: NEGATIVE
GLUCOSE UA: NEGATIVE
LEUKOCYTES UA: NEGATIVE
NITRITE UA: NEGATIVE
PH UA: 6
Protein, UA: NEGATIVE
Spec Grav, UA: 1.015
Urobilinogen, UA: NEGATIVE

## 2013-11-01 NOTE — Progress Notes (Signed)
Subjective:    Dawn Benson is a 28 y.o. female being seen today for her obstetrical visit. She is at [redacted]w[redacted]d gestation. Patient reports heartburn. Fetal movement: normal.  Problem List Items Addressed This Visit   Supervision of normal first pregnancy - Primary   Relevant Orders      POCT urinalysis dipstick     Patient Active Problem List   Diagnosis Date Noted  . UTI (lower urinary tract infection) 08/24/2013  . Supervision of normal first pregnancy 07/23/2013   Objective:    BP 120/77  Pulse 73  Temp(Src) 97.4 F (36.3 C)  Wt 225 lb (102.059 kg)  LMP 03/03/2013 FHT:  140 BPM  Uterine Size: size equals dates  Presentation: unsure     Assessment:    Pregnancy @ [redacted]w[redacted]d weeks   Plan:     labs reviewed, problem list updated Consent signed. GBS sent TDAP offered  Rhogam given for RH negative Pediatrician: discussed. Infant feeding: plans to breastfeed. Maternity leave: not discussed. Cigarette smoking: never smoked. Orders Placed This Encounter  Procedures  . POCT urinalysis dipstick   No orders of the defined types were placed in this encounter.   Follow up in 1 Week.

## 2013-11-02 MED ORDER — OMEPRAZOLE 20 MG PO CPDR: 20.0000 mg | DELAYED_RELEASE_CAPSULE | Freq: Two times a day (BID) | ORAL | Status: DC

## 2013-11-02 NOTE — Addendum Note (Signed)
Addended by: Shelly Bombard on: 11/02/2013 05:44 PM   Modules accepted: Orders

## 2013-11-16 ENCOUNTER — Encounter: Payer: Medicaid Other | Admitting: Obstetrics

## 2013-11-17 ENCOUNTER — Ambulatory Visit (INDEPENDENT_AMBULATORY_CARE_PROVIDER_SITE_OTHER): Payer: Medicaid Other | Admitting: Obstetrics

## 2013-11-17 VITALS — BP 111/77 | HR 76 | Temp 97.8°F | Wt 227.0 lb

## 2013-11-17 DIAGNOSIS — Z3400 Encounter for supervision of normal first pregnancy, unspecified trimester: Secondary | ICD-10-CM

## 2013-11-17 DIAGNOSIS — Z3403 Encounter for supervision of normal first pregnancy, third trimester: Secondary | ICD-10-CM

## 2013-11-17 LAB — POCT URINALYSIS DIPSTICK
Bilirubin, UA: NEGATIVE
GLUCOSE UA: NEGATIVE
Ketones, UA: NEGATIVE
NITRITE UA: NEGATIVE
PH UA: 7
Spec Grav, UA: 1.015
UROBILINOGEN UA: NEGATIVE

## 2013-11-18 ENCOUNTER — Encounter: Payer: Medicaid Other | Admitting: Obstetrics

## 2013-11-18 ENCOUNTER — Encounter: Payer: Self-pay | Admitting: Obstetrics

## 2013-11-18 LAB — STREP B DNA PROBE: GBSP: NOT DETECTED

## 2013-11-18 NOTE — Progress Notes (Signed)
Subjective:    Dawn Benson is a 28 y.o. female being seen today for her obstetrical visit. She is at [redacted]w[redacted]d gestation. Patient reports no complaints. Fetal movement: normal.  Problem List Items Addressed This Visit   Supervision of normal first pregnancy - Primary   Relevant Orders      POCT urinalysis dipstick (Completed)      Strep B DNA probe     Patient Active Problem List   Diagnosis Date Noted  . UTI (lower urinary tract infection) 08/24/2013  . Supervision of normal first pregnancy 07/23/2013    Objective:    BP 111/77  Pulse 76  Temp(Src) 97.8 F (36.6 C)  Wt 227 lb (102.967 kg)  LMP 03/03/2013 FHT: 140 BPM  Uterine Size: size equals dates  Presentations: unsure  Pelvic Exam: Deferred    Assessment:    Pregnancy @ [redacted]w[redacted]d weeks   Plan:   Plans for delivery: Vaginal anticipated; labs reviewed; problem list updated Counseling: Consent signed. Infant feeding: plans to breastfeed. Cigarette smoking: never smoked. L&D discussion: symptoms of labor, discussed when to call, discussed what number to call, anesthetic/analgesic options reviewed and delivering clinician:  plans Physician. Postpartum supports and preparation: circumcision discussed and contraception plans discussed.  Follow up in 1 Week.

## 2013-11-30 ENCOUNTER — Encounter: Payer: Self-pay | Admitting: Obstetrics

## 2013-11-30 ENCOUNTER — Ambulatory Visit (INDEPENDENT_AMBULATORY_CARE_PROVIDER_SITE_OTHER): Payer: Medicaid Other | Admitting: Obstetrics

## 2013-11-30 VITALS — BP 126/96 | Temp 97.8°F | Wt 231.0 lb

## 2013-11-30 DIAGNOSIS — Z3403 Encounter for supervision of normal first pregnancy, third trimester: Secondary | ICD-10-CM

## 2013-11-30 DIAGNOSIS — Z3400 Encounter for supervision of normal first pregnancy, unspecified trimester: Secondary | ICD-10-CM

## 2013-11-30 NOTE — Progress Notes (Signed)
Subjective:    Dawn Benson is a 28 y.o. female being seen today for her obstetrical visit. She is at [redacted]w[redacted]d gestation. Patient reports no complaints. Fetal movement: normal.  Problem List Items Addressed This Visit   None     Patient Active Problem List   Diagnosis Date Noted  . UTI (lower urinary tract infection) 08/24/2013  . Supervision of normal first pregnancy 07/23/2013    Objective:    BP 126/96  Temp(Src) 97.8 F (36.6 C)  Wt 231 lb (104.781 kg)  LMP 03/03/2013 FHT: 140 BPM  Uterine Size: size equals dates  Presentations: cephalic  Pelvic Exam:              Dilation: 2cm       Effacement: 75%             Station:  -2    Consistency: soft            Position: middle     Assessment:    Pregnancy @ [redacted]w[redacted]d weeks   Plan:   Plans for delivery: Vaginal anticipated; labs reviewed; problem list updated Counseling: Consent signed. Infant feeding: plans to breastfeed. Cigarette smoking: never smoked. L&D discussion: symptoms of labor, discussed when to call, discussed what number to call, anesthetic/analgesic options reviewed and delivering clinician:  plans Physician. Postpartum supports and preparation: circumcision discussed and contraception plans discussed.  Follow up in 1 Week.

## 2013-12-07 ENCOUNTER — Encounter (HOSPITAL_COMMUNITY): Payer: Self-pay | Admitting: *Deleted

## 2013-12-07 ENCOUNTER — Ambulatory Visit (INDEPENDENT_AMBULATORY_CARE_PROVIDER_SITE_OTHER): Payer: Medicaid Other | Admitting: Obstetrics

## 2013-12-07 ENCOUNTER — Inpatient Hospital Stay (HOSPITAL_COMMUNITY)
Admission: AD | Admit: 2013-12-07 | Discharge: 2013-12-07 | Disposition: A | Discharge status: Home / Self Care | Payer: Medicaid Other | Source: Ambulatory Visit | Attending: Obstetrics & Gynecology | Admitting: Obstetrics & Gynecology

## 2013-12-07 VITALS — BP 120/80 | Temp 97.9°F | Wt 230.0 lb

## 2013-12-07 DIAGNOSIS — R42 Dizziness and giddiness: Secondary | ICD-10-CM | POA: Diagnosis not present

## 2013-12-07 DIAGNOSIS — Z3400 Encounter for supervision of normal first pregnancy, unspecified trimester: Secondary | ICD-10-CM

## 2013-12-07 DIAGNOSIS — O479 False labor, unspecified: Secondary | ICD-10-CM | POA: Insufficient documentation

## 2013-12-07 DIAGNOSIS — O99891 Other specified diseases and conditions complicating pregnancy: Secondary | ICD-10-CM | POA: Diagnosis present

## 2013-12-07 DIAGNOSIS — Z3403 Encounter for supervision of normal first pregnancy, third trimester: Secondary | ICD-10-CM

## 2013-12-07 DIAGNOSIS — N390 Urinary tract infection, site not specified: Secondary | ICD-10-CM

## 2013-12-07 DIAGNOSIS — O9989 Other specified diseases and conditions complicating pregnancy, childbirth and the puerperium: Principal | ICD-10-CM

## 2013-12-07 LAB — POCT URINALYSIS DIPSTICK
Bilirubin, UA: NEGATIVE
Glucose, UA: NEGATIVE
Ketones, UA: NEGATIVE
LEUKOCYTES UA: NEGATIVE
NITRITE UA: NEGATIVE
PH UA: 7
Protein, UA: NEGATIVE
RBC UA: NEGATIVE
Spec Grav, UA: 1.01
UROBILINOGEN UA: NEGATIVE

## 2013-12-07 NOTE — MAU Note (Signed)
PT SAYS SHE STARTED HURT BAD A T10PM. VE - 2 CM  ON LAST WED.     FEELS DIZZY  BEFORE UC START.     DENIES HSV AND MRSA. GBS- NEG

## 2013-12-08 ENCOUNTER — Encounter: Payer: Self-pay | Admitting: Obstetrics

## 2013-12-08 NOTE — Progress Notes (Signed)
Subjective:    Dawn Benson is a 28 y.o. female being seen today for her obstetrical visit. She is at [redacted]w[redacted]d gestation. Patient reports no complaints. Fetal movement: normal.  Problem List Items Addressed This Visit   None     Patient Active Problem List   Diagnosis Date Noted  . UTI (lower urinary tract infection) 08/24/2013  . Supervision of normal first pregnancy 07/23/2013    Objective:    BP 120/80  Temp(Src) 97.9 F (36.6 C)  Wt 230 lb (104.327 kg)  LMP 03/03/2013 FHT: 140 BPM  Uterine Size: size equals dates  Presentations: cephalic  Pelvic Exam: Deferred    Assessment:    Pregnancy @ [redacted]w[redacted]d weeks   Plan:   Plans for delivery: Vaginal anticipated; labs reviewed; problem list updated Counseling: Consent signed. Infant feeding: plans to breastfeed. Cigarette smoking: never smoked. L&D discussion: symptoms of labor, discussed when to call, discussed what number to call, anesthetic/analgesic options reviewed and delivering clinician:  plans Physician. Postpartum supports and preparation: circumcision discussed and contraception plans discussed.  Follow up in 1 Week.

## 2013-12-09 ENCOUNTER — Inpatient Hospital Stay (HOSPITAL_COMMUNITY): Payer: Medicaid Other

## 2013-12-09 ENCOUNTER — Inpatient Hospital Stay (HOSPITAL_COMMUNITY)
Admission: AD | Admit: 2013-12-09 | Discharge: 2013-12-11 | DRG: 775 | Disposition: A | Discharge status: Home / Self Care | Payer: Medicaid Other | Source: Ambulatory Visit | Attending: Obstetrics | Admitting: Obstetrics

## 2013-12-09 ENCOUNTER — Encounter (HOSPITAL_COMMUNITY): Payer: Self-pay

## 2013-12-09 DIAGNOSIS — O368190 Decreased fetal movements, unspecified trimester, not applicable or unspecified: Principal | ICD-10-CM | POA: Diagnosis present

## 2013-12-09 DIAGNOSIS — O289 Unspecified abnormal findings on antenatal screening of mother: Secondary | ICD-10-CM

## 2013-12-09 DIAGNOSIS — Z8249 Family history of ischemic heart disease and other diseases of the circulatory system: Secondary | ICD-10-CM

## 2013-12-09 DIAGNOSIS — O288 Other abnormal findings on antenatal screening of mother: Secondary | ICD-10-CM | POA: Diagnosis present

## 2013-12-09 HISTORY — DX: Other specified health status: Z78.9

## 2013-12-09 LAB — CBC
HEMATOCRIT: 34 % — AB (ref 36.0–46.0)
HEMOGLOBIN: 11.4 g/dL — AB (ref 12.0–15.0)
MCH: 28.1 pg (ref 26.0–34.0)
MCHC: 33.5 g/dL (ref 30.0–36.0)
MCV: 83.7 fL (ref 78.0–100.0)
Platelets: 196 10*3/uL (ref 150–400)
RBC: 4.06 MIL/uL (ref 3.87–5.11)
RDW: 12.9 % (ref 11.5–15.5)
WBC: 9.8 10*3/uL (ref 4.0–10.5)

## 2013-12-09 LAB — TYPE AND SCREEN
ABO/RH(D): A POS
ANTIBODY SCREEN: NEGATIVE

## 2013-12-09 LAB — RPR

## 2013-12-09 LAB — ABO/RH: ABO/RH(D): A POS

## 2013-12-09 MED ORDER — OXYTOCIN BOLUS FROM INFUSION
500.0000 mL | INTRAVENOUS | Status: DC
  Administered 2013-12-09: 500 mL via INTRAVENOUS

## 2013-12-09 MED ORDER — OXYTOCIN 40 UNITS IN LACTATED RINGERS INFUSION - SIMPLE MED
1.0000 m[IU]/min | INTRAVENOUS | Status: DC
  Administered 2013-12-09: 2 m[IU]/min via INTRAVENOUS

## 2013-12-09 MED ORDER — TERBUTALINE SULFATE 1 MG/ML IJ SOLN: 0.2500 mg | Freq: Once | INTRAMUSCULAR | Status: AC | PRN

## 2013-12-09 MED ORDER — FLEET ENEMA 7-19 GM/118ML RE ENEM: 1.0000 | ENEMA | RECTAL | Status: DC | PRN

## 2013-12-09 MED ORDER — MISOPROSTOL 25 MCG QUARTER TABLET
25.0000 ug | ORAL_TABLET | ORAL | Status: DC | PRN
  Administered 2013-12-09: 25 ug via VAGINAL
  Filled 2013-12-09: qty 0.25

## 2013-12-09 MED ORDER — CITRIC ACID-SODIUM CITRATE 334-500 MG/5ML PO SOLN: 30.0000 mL | ORAL | Status: DC | PRN

## 2013-12-09 MED ORDER — ACETAMINOPHEN 325 MG PO TABS: 650.0000 mg | ORAL_TABLET | ORAL | Status: DC | PRN

## 2013-12-09 MED ORDER — ONDANSETRON HCL 4 MG/2ML IJ SOLN: 4.0000 mg | Freq: Four times a day (QID) | INTRAMUSCULAR | Status: DC | PRN

## 2013-12-09 MED ORDER — BUTORPHANOL TARTRATE 1 MG/ML IJ SOLN
1.0000 mg | INTRAMUSCULAR | Status: DC | PRN
  Administered 2013-12-09: 1 mg via INTRAVENOUS
  Filled 2013-12-09: qty 1

## 2013-12-09 MED ORDER — NALBUPHINE HCL 10 MG/ML IJ SOLN
10.0000 mg | Freq: Once | INTRAMUSCULAR | Status: AC
  Administered 2013-12-09: 10 mg via INTRAMUSCULAR
  Filled 2013-12-09: qty 1

## 2013-12-09 MED ORDER — LACTATED RINGERS IV SOLN: 500.0000 mL | INTRAVENOUS | Status: DC | PRN

## 2013-12-09 MED ORDER — PROMETHAZINE HCL 25 MG/ML IJ SOLN
25.0000 mg | Freq: Once | INTRAMUSCULAR | Status: AC
  Administered 2013-12-09: 25 mg via INTRAVENOUS
  Filled 2013-12-09: qty 1

## 2013-12-09 MED ORDER — LIDOCAINE HCL (PF) 1 % IJ SOLN
30.0000 mL | INTRAMUSCULAR | Status: DC | PRN
  Administered 2013-12-09: 30 mL via SUBCUTANEOUS
  Filled 2013-12-09: qty 30

## 2013-12-09 MED ORDER — NALBUPHINE HCL 10 MG/ML IJ SOLN
10.0000 mg | Freq: Once | INTRAMUSCULAR | Status: AC
  Administered 2013-12-09: 10 mg via INTRAVENOUS
  Filled 2013-12-09: qty 1

## 2013-12-09 MED ORDER — OXYCODONE-ACETAMINOPHEN 5-325 MG PO TABS: 2.0000 | ORAL_TABLET | ORAL | Status: DC | PRN

## 2013-12-09 MED ORDER — OXYCODONE-ACETAMINOPHEN 5-325 MG PO TABS: 1.0000 | ORAL_TABLET | ORAL | Status: DC | PRN

## 2013-12-09 MED ORDER — OXYTOCIN 40 UNITS IN LACTATED RINGERS INFUSION - SIMPLE MED
62.5000 mL/h | INTRAVENOUS | Status: DC
  Filled 2013-12-09: qty 1000

## 2013-12-09 MED ORDER — LACTATED RINGERS IV SOLN
INTRAVENOUS | Status: DC
Start: 2013-12-09 — End: 2013-12-10
  Administered 2013-12-09: 04:00:00 via INTRAVENOUS
  Administered 2013-12-09: 125 mL/h via INTRAVENOUS
  Administered 2013-12-09: 12:00:00 via INTRAVENOUS

## 2013-12-09 NOTE — Progress Notes (Signed)
Dawn Benson is a 28 y.o. G3P0020 at [redacted]w[redacted]d by LMP admitted for induction of labor due to Non-reactive NST.  Subjective:   Objective: BP 119/68  Pulse 62  Temp(Src) 98 F (36.7 C) (Oral)  Resp 18  Ht 5' 6.5" (1.689 m)  Wt 230 lb (104.327 kg)  BMI 36.57 kg/m2  SpO2 100%  LMP 03/03/2013      FHT:  FHR: 125 bpm, variability: moderate,  accelerations:  Present,  decelerations:  Absent UC:   regular, every 2-3 minutes SVE:   Dilation: 4.5 Effacement (%): 80 Station: -1 Exam by:: hk  Labs: Lab Results  Component Value Date   WBC 9.8 12/09/2013   HGB 11.4* 12/09/2013   HCT 34.0* 12/09/2013   MCV 83.7 12/09/2013   PLT 196 12/09/2013    Assessment / Plan: Induction of labor due to non-reassuring fetal testing,  progressing well on pitocin  Labor: Progressing normally Preeclampsia:  n/a Fetal Wellbeing:  Category I Pain Control:  Nubain I/D:  n/a Anticipated MOD:  NSVD  HARPER,CHARLES A 12/09/2013, 5:17 PM

## 2013-12-09 NOTE — H&P (Signed)
Dawn Benson is a 28 y.o. female presenting for decreased fetal movement. Maternal Medical History:  Reason for admission: Non-reactive NST.  BPP 6/8.  Fetal activity: Perceived fetal activity is decreased.    Prenatal complications: no prenatal complications Prenatal Complications - Diabetes: none.    OB History   Grav Para Term Preterm Abortions TAB SAB Ect Mult Living   3    2  2         Past Medical History  Diagnosis Date  . Hypotension   . UTI in pregnancy   . Medical history non-contributory    Past Surgical History  Procedure Laterality Date  . Intrauterine device insertion    . No past surgeries     Family History: family history includes Fibroids in her mother; Hypertension in her father and mother. Social History:  reports that she has never smoked. She has never used smokeless tobacco. She reports that she does not drink alcohol or use illicit drugs.     Review of Systems  Constitutional: Negative for fever.  Eyes: Negative for blurred vision.  Respiratory: Negative for shortness of breath.   Gastrointestinal: Negative for vomiting.  Skin: Negative for rash.  Neurological: Negative for headaches.    Dilation: Closed Exam by:: J Wenzel PA Blood pressure 125/75, pulse 70, temperature 98.2 F (36.8 C), temperature source Oral, resp. rate 18, height 5' 6.5" (1.689 m), weight 104.327 kg (230 lb), last menstrual period 03/03/2013, SpO2 100.00%. Maternal Exam:  Uterine Assessment: Contraction frequency is irregular.   Abdomen: not evaluated.  Introitus: not evaluated.   Cervix: Cervix evaluated by digital exam.     Fetal Exam Fetal Monitor Review: Baseline rate: 120.  Variability: moderate (6-25 bpm).   Pattern: accelerations present and no decelerations.    Fetal State Assessment: Category I - tracings are normal.     Physical Exam  Constitutional: She appears well-developed.  HENT:  Head: Normocephalic.  Neck: Neck supple. No thyromegaly  present.  Cardiovascular: Normal rate and regular rhythm.   Respiratory: Breath sounds normal.  GI: Soft. Bowel sounds are normal.  Skin: No rash noted.    Prenatal labs: ABO, Rh: --/--/A POS (09/03 0215) Antibody: NEG (09/03 0215) Rubella: 1.73 (04/17 1559) RPR: NON REAC (05/15 1327)  HBsAg: NEGATIVE (04/17 1559)  HIV: NONREACTIVE (05/15 1327)  GBS: NOT DETECTED (08/12 1624)   Assessment/Plan: IUP @ [redacted]w[redacted]d.  Decreased fetal movement.  BPP 6/8.  Prolonged period of diminished variability now reactive with accelerations--Category I FHT.  Unfavorable cervix.  Admit Two-stage IOL   JACKSON-MOORE,Shon Indelicato A 12/09/2013, 3:43 AM

## 2013-12-09 NOTE — MAU Provider Note (Signed)
  History     CSN: [id number]  Arrival date and time: 12/09/13 0002   First Provider Initiated Contact with Patient 12/09/13 0030      Chief Complaint  Patient presents with  . Rupture of Membranes  . Decreased Fetal Movement   HPI [name] is a 28 y.o. G3P0020 at [redacted]w[redacted]d who presents to MAU today with complaint of decreased FM since this morning and possible LOF. She denies contractions or vaginal bleeding. She denies complications with the pregnancy.    OB History   Grav Para Term Preterm Abortions TAB SAB Ect Mult Living   3    2  2          Past Medical History  Diagnosis Date  . Hypotension   . UTI in pregnancy     Past Surgical History  Procedure Laterality Date  . Intrauterine device insertion      Family History  Problem Relation Age of Onset  . Fibroids Mother   . Hypertension Mother   . Hypertension Father     History  Substance Use Topics  . Smoking status: Never Smoker   . Smokeless tobacco: Never Used  . Alcohol Use: No    Allergies: No Known Allergies  Prescriptions prior to admission  Medication Sig Dispense Refill  . Docosahexaenoic Acid (PRENATAL DHA PO) Take 1 tablet by mouth daily.      . cyclobenzaprine (FLEXERIL) 10 MG tablet Take 1 tablet (10 mg total) by mouth 3 (three) times daily as needed for muscle spasms.  15 tablet  0  . oxyCODONE-acetaminophen (PERCOCET/ROXICET) 5-325 MG per tablet Take 1-2 tablets by mouth every 6 (six) hours as needed.  15 tablet  0    Review of Systems  Gastrointestinal: Positive for nausea. Negative for vomiting and abdominal pain.  Genitourinary: Negative for dysuria, urgency and frequency.       Neg - vaginal bleeding + LOF   Physical Exam   Blood pressure 129/88, pulse 71, temperature 98.3 F (36.8 C), temperature source Oral, resp. rate 16, height 5' 6.5" (1.689 m), weight 230 lb (104.327 kg), last menstrual period 03/03/2013, SpO2 100.00%.  Physical Exam  Constitutional: She is oriented  to person, place, and time. She appears well-developed and well-nourished. No distress.  HENT:  Head: Normocephalic.  Cardiovascular: Normal rate.   Respiratory: Effort normal.  Genitourinary: No bleeding around the vagina. Vaginal discharge (small amount of thin, white discharge noted) found.  Neg - pooling  Neurological: She is alert and oriented to person, place, and time.  Skin: Skin is warm and dry. No erythema.  Psychiatric: She has a normal mood and affect.  Dilation: Closed Exam by:: Tomi Bamberger PA  Fetal Movement: Baseline: 120 bpm, minimal variability, few accelerations, no decelerations Contractions: none  MAU Course  Procedures None  MDM Fern - negative BPP ordered for non-reactive NST Per MFM 6/8. Recommend delivery.  Discussed with Dr. Delsa Sale. Admit to L&D. She will review the tracing and add induction orders to routine admit orders  Assessment and Plan  A: SIUP at [redacted]w[redacted]d Membranes intact Non-reactive NST BPP 6/8  P: Admit to L&D for induction of labor  Dawn Redden, PA-C  12/09/2013, 2:14 AM

## 2013-12-09 NOTE — MAU Note (Signed)
Pt reports decreased fetal movement for the last 24 hours, also reports ? Leaking fluid since 5 am,

## 2013-12-09 NOTE — MAU Note (Signed)
Tomi Bamberger PA in to do pelvic to r/o SROM.

## 2013-12-10 ENCOUNTER — Encounter (HOSPITAL_COMMUNITY): Payer: Self-pay | Admitting: *Deleted

## 2013-12-10 LAB — CBC
HCT: 29.1 % — ABNORMAL LOW (ref 36.0–46.0)
Hemoglobin: 9.5 g/dL — ABNORMAL LOW (ref 12.0–15.0)
MCH: 27.2 pg (ref 26.0–34.0)
MCHC: 32.6 g/dL (ref 30.0–36.0)
MCV: 83.4 fL (ref 78.0–100.0)
PLATELETS: 190 10*3/uL (ref 150–400)
RBC: 3.49 MIL/uL — ABNORMAL LOW (ref 3.87–5.11)
RDW: 13.1 % (ref 11.5–15.5)
WBC: 13.9 10*3/uL — ABNORMAL HIGH (ref 4.0–10.5)

## 2013-12-10 MED ORDER — FERROUS SULFATE 325 (65 FE) MG PO TABS
325.0000 mg | ORAL_TABLET | Freq: Two times a day (BID) | ORAL | Status: DC
  Administered 2013-12-10 – 2013-12-11 (×3): 325 mg via ORAL
  Filled 2013-12-10 (×4): qty 1

## 2013-12-10 MED ORDER — OXYCODONE-ACETAMINOPHEN 5-325 MG PO TABS
1.0000 | ORAL_TABLET | ORAL | Status: DC | PRN
  Administered 2013-12-10 – 2013-12-11 (×2): 1 via ORAL
  Filled 2013-12-10 (×2): qty 1

## 2013-12-10 MED ORDER — DIPHENHYDRAMINE HCL 25 MG PO CAPS: 25.0000 mg | ORAL_CAPSULE | Freq: Four times a day (QID) | ORAL | Status: DC | PRN

## 2013-12-10 MED ORDER — ONDANSETRON HCL 4 MG PO TABS: 4.0000 mg | ORAL_TABLET | ORAL | Status: DC | PRN

## 2013-12-10 MED ORDER — OXYCODONE-ACETAMINOPHEN 5-325 MG PO TABS
2.0000 | ORAL_TABLET | ORAL | Status: DC | PRN
  Filled 2013-12-10: qty 2

## 2013-12-10 MED ORDER — SIMETHICONE 80 MG PO CHEW: 80.0000 mg | CHEWABLE_TABLET | ORAL | Status: DC | PRN

## 2013-12-10 MED ORDER — IBUPROFEN 600 MG PO TABS
600.0000 mg | ORAL_TABLET | Freq: Four times a day (QID) | ORAL | Status: DC
  Administered 2013-12-10 – 2013-12-11 (×6): 600 mg via ORAL
  Filled 2013-12-10 (×6): qty 1

## 2013-12-10 MED ORDER — ONDANSETRON HCL 4 MG/2ML IJ SOLN: 4.0000 mg | INTRAMUSCULAR | Status: DC | PRN

## 2013-12-10 MED ORDER — TETANUS-DIPHTH-ACELL PERTUSSIS 5-2.5-18.5 LF-MCG/0.5 IM SUSP
0.5000 mL | Freq: Once | INTRAMUSCULAR | Status: AC
  Administered 2013-12-10: 0.5 mL via INTRAMUSCULAR
  Filled 2013-12-10: qty 0.5

## 2013-12-10 MED ORDER — DIBUCAINE 1 % RE OINT
1.0000 "application " | TOPICAL_OINTMENT | RECTAL | Status: DC | PRN
  Administered 2013-12-10: 1 via RECTAL
  Filled 2013-12-10 (×2): qty 28

## 2013-12-10 MED ORDER — SENNOSIDES-DOCUSATE SODIUM 8.6-50 MG PO TABS
2.0000 | ORAL_TABLET | ORAL | Status: DC
  Administered 2013-12-10 – 2013-12-11 (×2): 2 via ORAL
  Filled 2013-12-10: qty 2

## 2013-12-10 MED ORDER — ZOLPIDEM TARTRATE 5 MG PO TABS: 5.0000 mg | ORAL_TABLET | Freq: Every evening | ORAL | Status: DC | PRN

## 2013-12-10 MED ORDER — BENZOCAINE-MENTHOL 20-0.5 % EX AERO
1.0000 "application " | INHALATION_SPRAY | CUTANEOUS | Status: DC | PRN
  Administered 2013-12-10: 1 via TOPICAL
  Filled 2013-12-10 (×2): qty 56

## 2013-12-10 MED ORDER — WITCH HAZEL-GLYCERIN EX PADS
1.0000 "application " | MEDICATED_PAD | CUTANEOUS | Status: DC | PRN
  Administered 2013-12-10: 1 via TOPICAL

## 2013-12-10 MED ORDER — PRENATAL MULTIVITAMIN CH
1.0000 | ORAL_TABLET | Freq: Every day | ORAL | Status: DC
  Administered 2013-12-10: 1 via ORAL
  Filled 2013-12-10: qty 1

## 2013-12-10 MED ORDER — LANOLIN HYDROUS EX OINT: TOPICAL_OINTMENT | CUTANEOUS | Status: DC | PRN

## 2013-12-10 NOTE — Progress Notes (Signed)
UR completed 

## 2013-12-10 NOTE — Progress Notes (Signed)
Pt requesting family members hold baby, educated on benefits of skin to skin but requests family hold baby

## 2013-12-10 NOTE — Lactation Note (Signed)
This note was copied from the chart of Dawn Sagal Gayton. Lactation Consultation Note Initial consultation; baby 96 hours old, finishing bath and wide awake. Mom prefers to feed in side lying at this time. Assisted mom to get comfortable and place baby STS in side lying. Latch = 9.  Reviewed Baby & Me book's Breastfeeding Basics.  Community resources and lactation brochure reviewed with mom, and mom informed of o/p appts and BFSG. Enc mom to call for help if needed.  Patient Name: Dawn Benson [id number] Date: 12/10/2013 Reason for consult: Initial assessment   Maternal Data Formula Feeding for Exclusion: No Has patient been taught Hand Expression?: Yes Does the patient have breastfeeding experience prior to this delivery?: No  Feeding Feeding Type: Breast Fed Length of feed: 20 min  LATCH Score/Interventions Latch: Grasps breast easily, tongue down, lips flanged, rhythmical sucking.  Audible Swallowing: Spontaneous and intermittent  Type of Nipple: Everted at rest and after stimulation  Comfort (Breast/Nipple): Soft / non-tender     Hold (Positioning): Assistance needed to correctly position infant at breast and maintain latch.  LATCH Score: 9  Lactation Tools Discussed/Used     Consult Status Consult Status: Follow-up Follow-up type: In-patient    [facility] 12/10/2013, 10:56 AM

## 2013-12-10 NOTE — Addendum Note (Signed)
Addended by: Ladona Ridgel on: 12/10/2013 10:46 AM   Modules accepted: Orders

## 2013-12-10 NOTE — Progress Notes (Signed)
Post Partum Day 1 Subjective: no complaints  Objective: Blood pressure 112/46, pulse 68, temperature 97.9 F (36.6 C), temperature source Axillary, resp. rate 18, height 5' 6.5" (1.689 m), weight 230 lb (104.327 kg), last menstrual period 03/03/2013, SpO2 97.00%, unknown if currently breastfeeding.  Physical Exam:  General: alert and no distress Lochia: appropriate Uterine Fundus: firm Incision: none DVT Evaluation: No evidence of DVT seen on physical exam.   Recent Labs  12/09/13 0215 12/10/13 0555  HGB 11.4* 9.5*  HCT 34.0* 29.1*    Assessment/Plan: Plan for discharge tomorrow   LOS: 1 day   Simcha Farrington A 12/10/2013, 11:24 AM

## 2013-12-11 MED ORDER — OXYCODONE-ACETAMINOPHEN 5-325 MG PO TABS: 1.0000 | ORAL_TABLET | ORAL | Status: DC | PRN

## 2013-12-11 MED ORDER — IBUPROFEN 600 MG PO TABS: 600.0000 mg | ORAL_TABLET | Freq: Four times a day (QID) | ORAL | Status: DC | PRN

## 2013-12-11 NOTE — Lactation Note (Signed)
This note was copied from the chart of Dawn Benson. Lactation Consultation Note     Follow up consult with this mom and baby, now 32 hours old, and going home today. Mom has had trouble latching baby. I had her use cross cradle, and had her position her hands on baby's back instead of head, and explained to her to not make a "breathing space' for her baby's nose, since both of these cause the baby to end up with a shallow latch. With proper positioning, the baby latched well, with deep latch, and mom was very pleased. encorgement care, if needed, reviewed with mom, and I advised mom to try and attend support group. Mom also knows to call lactation for any questions/concerns.  Patient Name: Dawn Oluwanifemi Petitti ASNKN'L Date: 12/11/2013 Reason for consult: Follow-up assessment   Maternal Data    Feeding Feeding Type: Breast Fed Length of feed: 5 min  LATCH Score/Interventions Latch: Grasps breast easily, tongue down, lips flanged, rhythmical sucking. Intervention(s): Assist with latch  Audible Swallowing: A few with stimulation Intervention(s): Hand expression  Type of Nipple: Everted at rest and after stimulation  Comfort (Breast/Nipple): Soft / non-tender     Hold (Positioning): Assistance needed to correctly position infant at breast and maintain latch. Intervention(s): Breastfeeding basics reviewed;Support Pillows;Position options;Skin to skin  LATCH Score: 8  Lactation Tools Discussed/Used     Consult Status Consult Status: Complete Follow-up type: Call as needed    Tonna Corner 12/11/2013, 9:10 AM

## 2013-12-11 NOTE — Progress Notes (Signed)
Post Partum Day 2 Subjective: no complaints  Objective: Blood pressure 104/56, pulse 66, temperature 98.6 F (37 C), temperature source Oral, resp. rate 18, height 5' 6.5" (1.689 m), weight 230 lb (104.327 kg), last menstrual period 03/03/2013, SpO2 97.00%, unknown if currently breastfeeding.  Physical Exam:  General: alert and no distress Lochia: appropriate Uterine Fundus: firm Incision: none DVT Evaluation: No evidence of DVT seen on physical exam.   Recent Labs  12/09/13 0215 12/10/13 0555  HGB 11.4* 9.5*  HCT 34.0* 29.1*    Assessment/Plan: Discharge home   LOS: 2 days   Burnette Sautter A 12/11/2013, 8:24 AM

## 2013-12-11 NOTE — Discharge Summary (Signed)
Obstetric Discharge Summary Reason for Admission: Nonreactive NST with decreased fetal movement at [redacted] weeks gestation Prenatal Procedures: ultrasound Intrapartum Procedures: spontaneous vaginal delivery Postpartum Procedures: none Complications-Operative and Postpartum: none Hemoglobin  Date Value Ref Range Status  12/10/2013 9.5* 12.0 - 15.0 g/dL Final     HCT  Date Value Ref Range Status  12/10/2013 29.1* 36.0 - 46.0 % Final    Physical Exam:  General: alert and no distress Lochia: appropriate Uterine Fundus: firm Incision: none DVT Evaluation: No evidence of DVT seen on physical exam.  Discharge Diagnoses: Term Pregnancy-delivered  Discharge Information: Date: 12/11/2013 Activity: pelvic rest Diet: routine Medications: PNV, Ibuprofen, Colace and Percocet Condition: stable Instructions: refer to practice specific booklet Discharge to: home Follow-up Information   Follow up with Joylene Wescott A, MD On 12/29/2013. (12/29/2013 @ 11:15 am)    Specialty:  Obstetrics and Gynecology   Contact information:   [address] [place] [address] [phone]       Follow up with Agnes Lawrence, MD In 2 weeks.   Specialty:  Obstetrics and Gynecology   Contact information:   [address] [id number] [phone]       Newborn Data: Live born female  Birth Weight: 7 lb 6.5 oz (3360 g) APGAR: 7, 9  Home with mother.  Nickia Boesen A 12/11/2013, 8:30 AM

## 2013-12-14 ENCOUNTER — Encounter: Payer: Medicaid Other | Admitting: Obstetrics

## 2013-12-29 ENCOUNTER — Encounter: Payer: Self-pay | Admitting: Obstetrics

## 2013-12-29 ENCOUNTER — Ambulatory Visit (INDEPENDENT_AMBULATORY_CARE_PROVIDER_SITE_OTHER): Payer: Medicaid Other | Admitting: Obstetrics

## 2013-12-29 DIAGNOSIS — Z3169 Encounter for other general counseling and advice on procreation: Secondary | ICD-10-CM

## 2013-12-29 NOTE — Progress Notes (Signed)
Subjective:     Dawn Benson is a 28 y.o. female who presents for a postpartum visit. She is 3 weeks postpartum following a spontaneous vaginal delivery. I have fully reviewed the prenatal and intrapartum course. The delivery was at 40 gestational weeks. Outcome: spontaneous vaginal delivery. Anesthesia: local and IV sedation. Postpartum course has been normal. Baby's course has been nomal. Baby is feeding by breast. Bleeding thin lochia. Bowel function is normal. Bladder function is normal. Patient is not sexually active. Contraception method is abstinence. Postpartum depression screening: negative.  Tobacco, alcohol and substance abuse history reviewed.  Adult immunizations reviewed including TDAP, rubella and varicella.  The following portions of the patient's history were reviewed and updated as appropriate: allergies, current medications, past family history, past medical history, past social history, past surgical history and problem list.  Review of Systems A comprehensive review of systems was negative.   Objective:    BP 135/73  Pulse 63  Temp(Src) 97.6 F (36.4 C)  Ht 5' 6.5" (1.689 m)  Wt 198 lb (89.812 kg)  BMI 31.48 kg/m2  Breastfeeding? Yes   PE:  Deferred   100% of 10 min visit spent on counseling and coordination of care.  Assessment:    3 weeks postpartum.  Doing well.  Breast feeding.  Plan:    1. Contraception: condoms 2. Pregnancy planned for next year.  Continue PNV's. 3. Follow up in: 3 weeks or as needed.   Preconception counseling provided Healthy lifestyle practices reviewed

## 2014-01-17 ENCOUNTER — Ambulatory Visit: Payer: Medicaid Other | Admitting: Obstetrics

## 2014-01-20 ENCOUNTER — Ambulatory Visit: Payer: Medicaid Other | Admitting: Obstetrics

## 2014-02-07 ENCOUNTER — Encounter: Payer: Self-pay | Admitting: Obstetrics

## 2014-04-08 NOTE — L&D Delivery Note (Cosign Needed)
Delivery Note This is a 30 year old G 4 P1 who was admitted for Not in labor. and elective IOL at term. She progressed normally with cytotec and IVP fentanyl to the second stage of labor.  She pushed for 5 min.  At 3:14 PM a viable female was delivered via Vaginal, Spontaneous Delivery (Presentation: ; Occiput Anterior).  APGAR: 8, 9; weight &#7oz .  A nuchal cord was not identified. Infant placed on maternal abdomen.  Delayed cord clamping for 5 minutes. Cord double clamped and cut. 2 Apgar scores were 8 and 9. The placenta delivered spontaneously, shultz, with a 3 vessel cord.  Inspection revealed 2nd degree. The uterus was firm bleeding stable.  The repair was done under local.   EBL was 237.    Placenta and umbilical artery blood gas were not sent.  There were no complications during the procedure.  Mom and baby skin to skin following delivery. Left in stable condition.   Placenta status: Intact, Spontaneous.  Cord: 3 vessels with the following complications: None.  Cord pH: N/A  Anesthesia: None  Episiotomy: None Lacerations: 2nd degree Suture Repair: 3.0 vicryl Est. Blood Loss (mL): 237  Mom to postpartum.  Baby to Couplet care / Skin to Skin.  Morene Crocker, CNM 12/28/2014, 4:22 PM

## 2014-06-24 ENCOUNTER — Encounter (HOSPITAL_COMMUNITY): Payer: Self-pay | Admitting: *Deleted

## 2014-06-24 ENCOUNTER — Inpatient Hospital Stay (HOSPITAL_COMMUNITY): Payer: Medicaid Other

## 2014-06-24 ENCOUNTER — Inpatient Hospital Stay (HOSPITAL_COMMUNITY)
Admission: AD | Admit: 2014-06-24 | Discharge: 2014-06-24 | Disposition: A | Discharge status: Home / Self Care | Payer: Medicaid Other | Source: Ambulatory Visit | Attending: Obstetrics and Gynecology | Admitting: Obstetrics and Gynecology

## 2014-06-24 DIAGNOSIS — O4691 Antepartum hemorrhage, unspecified, first trimester: Secondary | ICD-10-CM | POA: Diagnosis not present

## 2014-06-24 DIAGNOSIS — Z3A13 13 weeks gestation of pregnancy: Secondary | ICD-10-CM | POA: Diagnosis not present

## 2014-06-24 DIAGNOSIS — Z3491 Encounter for supervision of normal pregnancy, unspecified, first trimester: Secondary | ICD-10-CM

## 2014-06-24 DIAGNOSIS — O209 Hemorrhage in early pregnancy, unspecified: Secondary | ICD-10-CM | POA: Diagnosis present

## 2014-06-24 LAB — CBC WITH DIFFERENTIAL/PLATELET
Basophils Absolute: 0 10*3/uL (ref 0.0–0.1)
Basophils Relative: 0 % (ref 0–1)
Eosinophils Absolute: 0 10*3/uL (ref 0.0–0.7)
Eosinophils Relative: 0 % (ref 0–5)
HEMATOCRIT: 35.2 % — AB (ref 36.0–46.0)
Hemoglobin: 11.9 g/dL — ABNORMAL LOW (ref 12.0–15.0)
LYMPHS PCT: 21 % (ref 12–46)
Lymphs Abs: 1.8 10*3/uL (ref 0.7–4.0)
MCH: 28.1 pg (ref 26.0–34.0)
MCHC: 33.8 g/dL (ref 30.0–36.0)
MCV: 83 fL (ref 78.0–100.0)
MONOS PCT: 6 % (ref 3–12)
Monocytes Absolute: 0.5 10*3/uL (ref 0.1–1.0)
NEUTROS PCT: 73 % (ref 43–77)
Neutro Abs: 6.1 10*3/uL (ref 1.7–7.7)
Platelets: 200 10*3/uL (ref 150–400)
RBC: 4.24 MIL/uL (ref 3.87–5.11)
RDW: 13.6 % (ref 11.5–15.5)
WBC: 8.4 10*3/uL (ref 4.0–10.5)

## 2014-06-24 LAB — WET PREP, GENITAL
Clue Cells Wet Prep HPF POC: NONE SEEN
TRICH WET PREP: NONE SEEN
WBC, Wet Prep HPF POC: NONE SEEN
Yeast Wet Prep HPF POC: NONE SEEN

## 2014-06-24 LAB — URINALYSIS, ROUTINE W REFLEX MICROSCOPIC
BILIRUBIN URINE: NEGATIVE
Glucose, UA: NEGATIVE mg/dL
Ketones, ur: 40 mg/dL — AB
NITRITE: NEGATIVE
PH: 6 (ref 5.0–8.0)
Protein, ur: NEGATIVE mg/dL
Specific Gravity, Urine: 1.015 (ref 1.005–1.030)
Urobilinogen, UA: 0.2 mg/dL (ref 0.0–1.0)

## 2014-06-24 LAB — URINE MICROSCOPIC-ADD ON

## 2014-06-24 NOTE — MAU Note (Signed)
Started bleeding last night.   Heavy last night,Spotting today. Is having some abd pain.

## 2014-06-24 NOTE — MAU Provider Note (Signed)
History     CSN: [id number]  Arrival date and time: 06/24/14 1611   None     Chief Complaint  Patient presents with  . Vaginal Bleeding   HPI Dawn Benson is 29 y.o. G4P1021 [redacted]w[redacted]d weeks presenting with spotting that began last night.  Describes as light red blood, negative for clots.  Cramping.  She states she hasn't eaten much today, worried about bleeding.  Denies nausea and vomiting.  She is a patient of Dr. Jacelyn Grip.  Delivered 6 months ago.  Has not been see for this pregnancy. Plans to call Monday for appointment.   Past Medical History  Diagnosis Date  . Hypotension   . UTI in pregnancy   . Medical history non-contributory     Past Surgical History  Procedure Laterality Date  . Intrauterine device insertion    . No past surgeries      Family History  Problem Relation Age of Onset  . Fibroids Mother   . Hypertension Mother   . Hypertension Father     History  Substance Use Topics  . Smoking status: Never Smoker   . Smokeless tobacco: Never Used  . Alcohol Use: No    Allergies: No Known Allergies  Prescriptions prior to admission  Medication Sig Dispense Refill Last Dose  . Docosahexaenoic Acid (PRENATAL DHA PO) Take 1 tablet by mouth daily.   06/23/2014 at Unknown time  . ibuprofen (ADVIL,MOTRIN) 600 MG tablet Take 1 tablet (600 mg total) by mouth every 6 (six) hours as needed. (Patient not taking: Reported on 06/24/2014) 30 tablet 5 more than one month    Review of Systems  Constitutional: Negative for fever.  Cardiovascular: Negative for chest pain.  Gastrointestinal: Positive for abdominal pain. Negative for nausea and vomiting. Diarrhea: lower abdominal cramping.  Genitourinary: Negative for dysuria, urgency and frequency.       + for vaginal bleeding  Neurological: Negative for headaches.   Physical Exam   Blood pressure 126/66, pulse 66, temperature 97.8 F (36.6 C), temperature source Oral, resp. rate 18, height 5\' 5"  (1.651 m), weight 195  lb (88.451 kg), last menstrual period 03/25/2014, not currently breastfeeding.  Physical Exam  Constitutional: She is oriented to person, place, and time. She appears well-developed and well-nourished. No distress.  HENT:  Head: Normocephalic.  Neck: Normal range of motion.  Cardiovascular: Normal rate.   Respiratory: Effort normal.  GI: Soft. She exhibits no distension and no mass. There is no tenderness. There is no rebound and no guarding.  Genitourinary: There is no tenderness or lesion on the right labia. There is no tenderness or lesion on the left labia. Uterus is enlarged. Uterus is not tender. Cervix exhibits no motion tenderness, no discharge and no friability. Right adnexum displays no tenderness. Left adnexum displays no tenderness. There is bleeding (light red blood in vaginal canal.  Neg for clots) in the vagina. No erythema or tenderness in the vagina. No foreign body around the vagina. No vaginal discharge found.  Cervix is parous, closed  Neurological: She is alert and oriented to person, place, and time.  Skin: Skin is warm and dry.  Psychiatric: She has a normal mood and affect. Her behavior is normal.   FHT by doppler: 155.   ABO RH by previous record is A Positive  Results for orders placed or performed during the hospital encounter of 06/24/14 (from the past 24 hour(s))  Wet prep, genital     Status: None   Collection Time: 06/24/14  5:16 PM  Result Value Ref Range   Yeast Wet Prep HPF POC NONE SEEN NONE SEEN   Trich, Wet Prep NONE SEEN NONE SEEN   Clue Cells Wet Prep HPF POC NONE SEEN NONE SEEN   WBC, Wet Prep HPF POC NONE SEEN NONE SEEN  Urinalysis, Routine w reflex microscopic     Status: Abnormal   Collection Time: 06/24/14  5:19 PM  Result Value Ref Range   Color, Urine YELLOW YELLOW   APPearance CLEAR CLEAR   Specific Gravity, Urine 1.015 1.005 - 1.030   pH 6.0 5.0 - 8.0   Glucose, UA NEGATIVE NEGATIVE mg/dL   Hgb urine dipstick LARGE (A) NEGATIVE    Bilirubin Urine NEGATIVE NEGATIVE   Ketones, ur 40 (A) NEGATIVE mg/dL   Protein, ur NEGATIVE NEGATIVE mg/dL   Urobilinogen, UA 0.2 0.0 - 1.0 mg/dL   Nitrite NEGATIVE NEGATIVE   Leukocytes, UA TRACE (A) NEGATIVE  Urine microscopic-add on     Status: Abnormal   Collection Time: 06/24/14  5:19 PM  Result Value Ref Range   Squamous Epithelial / LPF FEW (A) RARE   WBC, UA 0-2 <3 WBC/hpf   RBC / HPF 21-50 <3 RBC/hpf   Bacteria, UA FEW (A) RARE       CLINICAL DATA: . Heavy vaginal bleeding. Estimated gestational age by last menstrual period equals 13 weeks 0 days.  EXAM: OBSTETRIC <14 WK ULTRASOUND  TECHNIQUE: Transabdominal ultrasound was performed for evaluation of the gestation as well as the maternal uterus and adnexal regions.  COMPARISON: None.  FINDINGS: Intrauterine gestational sac: Single  Yolk sac: Not identified  Embryo: Present  Cardiac Activity: Present  Heart Rate: 142 bpm  CRL: 74 mm 13 w 4 d [place] EDC: 12/26/2014  Maternal uterus/adnexae: Normal right ovary. Left ovary not identified. No free fluid.  IMPRESSION: 1. Single intrauterine gestation with embryo and normal cardiac activity.  2. Estimated gestational age by crown rump length equals 13 weeks 4 days.     MAU Course  Procedures MDM   Assessment and Plan  A: Vaginal bleeding at [redacted]w[redacted]d gestation     Viable fetus  P:  Patient instructed in PELVIC REST until bleeding completely stops       Call Dr. Jodi Mourning to make appt to begin prenatal care      Return for worsening sxs-increased bleeding, clots or increase in abdominal pain  Naven Giambalvo,EVE M 06/24/2014, 6:07 PM

## 2014-06-24 NOTE — Discharge Instructions (Signed)
Reposo plvico  (Pelvic Rest) El reposo plvico se recomienda a las mujeres cuando:   La placenta cubre parcial o completamente la abertura del cuello del tero (placenta previa).  Hay sangrado entre la pared del tero y el saco amnitico en el primer trimestre (hemorragia subcorinica).  El cuello uterino comienza a abrirse sin iniciarse el trabajo de parto (cuello uterino incompetente, insuficiencia cervical).  El [place] de parto se inicia muy pronto (parto prematuro). INSTRUCCIONES PARA EL CUIDADO EN EL HOGAR   No tenga relaciones sexuales, estimulacin, ni orgasmos.  No use tampones, no se haga duchas vaginales ni coloque ningn objeto en la vagina.  No levante objetos que pesen ms de 10 libras (4,5 kg).  Evite las actividades extenuantes o tensionar los msculos de la pelvis. SOLICITE ATENCIN MDICA SI:   Tiene un sangrado vaginal durante el embarazo. Considrelo como una posible emergencia.  Siente clicos en la zona baja del estmago (ms fuertes que los clicos menstruales).  Nota flujo vaginal (acuoso, con moco o [place]).  Siente un dolor en la espalda leve y sordo.  Tiene contracciones regulares o endurecimiento del tero. SOLICITE ATENCIN MDICA DE INMEDIATO SI:  Observa sangrado vaginal y tiene placenta previa.  Document Released: 12/18/2011 [facility] Patient Information 2015 [place]. This information is not intended to replace advice given to you by your health care provider. Make sure you discuss any questions you have with your health care provider. Hemorragia vaginal durante el embarazo (primer trimestre) (Vaginal Bleeding During Pregnancy, First Trimester) Durante los primeros meses de [place], es comn tener una pequea hemorragia vaginal (manchas). A veces, la hemorragia es normal y no representa un problema, pero en algunas ocasiones es un sntoma de algo grave. Asegrese de decirle a su mdico de inmediato si tiene algn tipo de hemorragia  vaginal. CUIDADOS EN EL HOGAR  Controle su afeccin para ver si hay cambios.  Siga las indicaciones de su mdico con respecto al [place] de actividad que [place].  Si debe hacer reposo en cama:  Es posible que deba quedarse en cama y levantarse nicamente para ir al bao.  Quizs le permitan hacer [employer].  Si es necesario, planifique que alguien la ayude.  Alla [place]:  La cantidad de toallas higinicas que [place] cada da.  La frecuencia con la que se cambia las toallas higinicas.  Indique que tan empapados (saturados) estn.  No use tampones.  No se haga duchas vaginales.  No tenga relaciones sexuales ni orgasmos hasta que el mdico la autorice.  Si elimina tejido por la vagina, gurdelo para mostrrselo al [employer].  Tome los medicamentos solamente como se lo haya indicado el mdico.  No tome aspirina, ya que puede causar hemorragias.  Concurra a todas las visitas de control como se lo haya indicado el mdico. SOLICITE AYUDA SI:   Tiene una hemorragia vaginal.  Tiene clicos.  Tiene dolores de [place].  Tiene fiebre que no desaparece despus de [employer] data [employer]. SOLICITE AYUDA DE INMEDIATO SI:   Siente clicos muy intensos en la espalda o en el vientre (abdomen).  Elimina cogulos grandes o tejido por la vagina.  Tiene ms hemorragia.  Se siente dbil o que va a desvanecerse.  Pierde el conocimiento (se desmaya).  Tiene escalofros.  Tiene una prdida importante o sale lquido a borbotones por la vagina.  Se desmaya mientras defeca. ASEGRESE DE QUE:  Comprende estas instrucciones.  Controlar su afeccin.  Recibir ayuda de inmediato si no mejora o si empeora. Document Released: 08/09/2013 [employer] Patient  Information 2015 [place], [place]. This information is not intended to replace advice given to you by your health care provider. Make sure you discuss any questions you have with your health care provider.

## 2014-06-25 LAB — HIV ANTIBODY (ROUTINE TESTING W REFLEX): HIV Screen 4th Generation wRfx: NONREACTIVE

## 2014-06-27 LAB — GC/CHLAMYDIA PROBE AMP (~~LOC~~) NOT AT ARMC
Chlamydia: NEGATIVE
Neisseria Gonorrhea: NEGATIVE

## 2014-08-26 ENCOUNTER — Encounter: Payer: Self-pay | Admitting: Certified Nurse Midwife

## 2014-08-26 ENCOUNTER — Ambulatory Visit (INDEPENDENT_AMBULATORY_CARE_PROVIDER_SITE_OTHER): Payer: Medicaid Other | Admitting: Certified Nurse Midwife

## 2014-08-26 VITALS — BP 112/74 | HR 67 | Temp 98.4°F | Wt 207.0 lb

## 2014-08-26 DIAGNOSIS — O0930 Supervision of pregnancy with insufficient antenatal care, unspecified trimester: Secondary | ICD-10-CM | POA: Insufficient documentation

## 2014-08-26 DIAGNOSIS — Z3482 Encounter for supervision of other normal pregnancy, second trimester: Secondary | ICD-10-CM | POA: Diagnosis not present

## 2014-08-26 LAB — POCT URINALYSIS DIPSTICK
Bilirubin, UA: NEGATIVE
Blood, UA: NEGATIVE
GLUCOSE UA: NEGATIVE
Leukocytes, UA: NEGATIVE
NITRITE UA: NEGATIVE
PROTEIN UA: NEGATIVE
Spec Grav, UA: 1.015
Urobilinogen, UA: NEGATIVE
pH, UA: 5

## 2014-08-26 NOTE — Progress Notes (Signed)
Subjective:    Dawn Benson is being seen today for her first obstetrical visit.  This is a planned pregnancy. She is at [redacted]w[redacted]d gestation. Her obstetrical history is significant for close spaced pregnancies. Relationship with FOB: spouse, living together. Patient does intend to breast feed. Pregnancy history fully reviewed.  Was lactating and breastfed for about 6 months.    The information documented in the HPI was reviewed and verified.  Menstrual History: OB History    Gravida Para Term Preterm AB TAB SAB Ectopic Multiple Living   4 1 1  2  2   1       Menarche age: 58  Patient's last menstrual period was 03/25/2014.    Past Medical History  Diagnosis Date  . Hypotension   . UTI in pregnancy   . Medical history non-contributory     Past Surgical History  Procedure Laterality Date  . Intrauterine device insertion    . No past surgeries       (Not in a hospital admission) No Known Allergies  History  Substance Use Topics  . Smoking status: Never Smoker   . Smokeless tobacco: Never Used  . Alcohol Use: No    Family History  Problem Relation Age of Onset  . Fibroids Mother   . Hypertension Mother   . Hypertension Father      Review of Systems Constitutional: negative for weight loss Gastrointestinal: negative for vomiting Genitourinary:negative for genital lesions and vaginal discharge and dysuria Musculoskeletal:negative for back pain Behavioral/Psych: negative for abusive relationship, depression, illegal drug usage and tobacco use    Objective:    BP 112/74 mmHg  Pulse 67  Temp(Src) 98.4 F (36.9 C)  Wt 93.895 kg (207 lb)  LMP 03/25/2014 General Appearance:    Alert, cooperative, no distress, appears stated age  Head:    Normocephalic, without obvious abnormality, atraumatic  Eyes:    PERRL, conjunctiva/corneas clear, EOM's intact, fundi    benign, both eyes  Ears:    Normal TM's and external ear canals, both ears  Nose:   Nares normal, septum midline,  mucosa normal, no drainage    or sinus tenderness  Throat:   Lips, mucosa, and tongue normal; teeth and gums normal  Neck:   Supple, symmetrical, trachea midline, no adenopathy;    thyroid:  no enlargement/tenderness/nodules; no carotid   bruit or JVD  Back:     Symmetric, no curvature, ROM normal, no CVA tenderness  Lungs:     Clear to auscultation bilaterally, respirations unlabored  Chest Wall:    No tenderness or deformity   Heart:    Regular rate and rhythm, S1 and S2 normal, no murmur, rub   or gallop  Breast Exam:    No tenderness, masses, or nipple abnormality  Abdomen:     Soft, non-tender, bowel sounds active all four quadrants,    no masses, no organomegaly  Genitalia:    Normal female without lesion, discharge or tenderness  Extremities:   Extremities normal, atraumatic, no cyanosis or edema  Pulses:   2+ and symmetric all extremities  Skin:   Skin color, texture, turgor normal, no rashes or lesions  Lymph nodes:   Cervical, supraclavicular, and axillary nodes normal  Neurologic:   CNII-XII intact, normal strength, sensation and reflexes    throughout      Lab Review Urine pregnancy test Labs reviewed yes Radiologic studies reviewed yes Assessment:    Pregnancy at [redacted]w[redacted]d weeks    Plan:  Prenatal vitamins.  Counseling provided regarding continued use of seat belts, cessation of alcohol consumption, smoking or use of illicit drugs; infection precautions i.e., influenza/TDAP immunizations, toxoplasmosis,CMV, parvovirus, listeria and varicella; workplace safety, exercise during pregnancy; routine dental care, safe medications, sexual activity, hot tubs, saunas, pools, travel, caffeine use, fish and methlymercury, potential toxins, hair treatments, varicose veins Weight gain recommendations per IOM guidelines reviewed: underweight/BMI< 18.5--> gain 28 - 40 lbs; normal weight/BMI 18.5 - 24.9--> gain 25 - 35 lbs; overweight/BMI 25 - 29.9--> gain 15 - 25 lbs; obese/BMI  >30->gain  11 - 20 lbs Problem list reviewed and updated. FIRST/CF mutation testing/NIPT/QUAD SCREEN/fragile X/Ashkenazi Jewish population testing/Spinal muscular atrophy discussed: declined. Role of ultrasound in pregnancy discussed; fetal survey: requested. Amniocentesis discussed: not indicated.   No orders of the defined types were placed in this encounter.   Orders Placed This Encounter  Procedures  . Culture, OB Urine  . SureSwab, Vaginosis/Vaginitis Plus  . US OB Comp + 14 Wk    Standing Status: Future     Number of Occurrences:      Standing Expiration Date: 10/26/2015    Order Specific Question:  Reason for Exam (SYMPTOM  OR DIAGNOSIS REQUIRED)    Answer:  fetal anatomy scan    Order Specific Question:  Preferred imaging location?    Answer:  Internal  . Obstetric panel  . HIV antibody  . Hemoglobinopathy evaluation  . Varicella zoster antibody, IgG  . Vit D  25 hydroxy (rtn osteoporosis monitoring)  . TSH  . POCT urinalysis dipstick    Follow up in 2 weeks. 50% of 30 min visit spent on counseling and coordination of care.

## 2014-08-27 LAB — VITAMIN D 25 HYDROXY (VIT D DEFICIENCY, FRACTURES): VIT D 25 HYDROXY: 27 ng/mL — AB (ref 30–100)

## 2014-08-27 LAB — CULTURE, OB URINE
Colony Count: NO GROWTH
Organism ID, Bacteria: NO GROWTH

## 2014-08-27 LAB — HIV ANTIBODY (ROUTINE TESTING W REFLEX): HIV 1&2 Ab, 4th Generation: NONREACTIVE

## 2014-08-27 LAB — TSH: TSH: 1.427 u[IU]/mL (ref 0.350–4.500)

## 2014-08-29 ENCOUNTER — Other Ambulatory Visit: Payer: Self-pay | Admitting: Certified Nurse Midwife

## 2014-08-29 ENCOUNTER — Ambulatory Visit (HOSPITAL_COMMUNITY)
Admission: RE | Admit: 2014-08-29 | Discharge: 2014-08-29 | Disposition: A | Discharge status: Home / Self Care | Payer: Medicaid Other | Source: Ambulatory Visit | Attending: Certified Nurse Midwife | Admitting: Certified Nurse Midwife

## 2014-08-29 DIAGNOSIS — Z3482 Encounter for supervision of other normal pregnancy, second trimester: Secondary | ICD-10-CM | POA: Insufficient documentation

## 2014-08-29 DIAGNOSIS — Z3689 Encounter for other specified antenatal screening: Secondary | ICD-10-CM | POA: Insufficient documentation

## 2014-08-29 DIAGNOSIS — Z3A22 22 weeks gestation of pregnancy: Secondary | ICD-10-CM | POA: Insufficient documentation

## 2014-08-29 LAB — OBSTETRIC PANEL
Antibody Screen: NEGATIVE
Basophils Absolute: 0 10*3/uL (ref 0.0–0.1)
Basophils Relative: 0 % (ref 0–1)
EOS ABS: 0 10*3/uL (ref 0.0–0.7)
Eosinophils Relative: 0 % (ref 0–5)
HCT: 36.9 % (ref 36.0–46.0)
HEP B S AG: NEGATIVE
Hemoglobin: 12.2 g/dL (ref 12.0–15.0)
LYMPHS ABS: 1.5 10*3/uL (ref 0.7–4.0)
Lymphocytes Relative: 14 % (ref 12–46)
MCH: 28.2 pg (ref 26.0–34.0)
MCHC: 33.1 g/dL (ref 30.0–36.0)
MCV: 85.4 fL (ref 78.0–100.0)
MONOS PCT: 6 % (ref 3–12)
MPV: 9.1 fL (ref 8.6–12.4)
Monocytes Absolute: 0.6 10*3/uL (ref 0.1–1.0)
NEUTROS PCT: 80 % — AB (ref 43–77)
Neutro Abs: 8.5 10*3/uL — ABNORMAL HIGH (ref 1.7–7.7)
PLATELETS: 258 10*3/uL (ref 150–400)
RBC: 4.32 MIL/uL (ref 3.87–5.11)
RDW: 13.8 % (ref 11.5–15.5)
RH TYPE: POSITIVE
Rubella: 3.54 Index — ABNORMAL HIGH (ref <0.90)
WBC: 10.6 10*3/uL — ABNORMAL HIGH (ref 4.0–10.5)

## 2014-08-29 LAB — PAP IG W/ RFLX HPV ASCU

## 2014-08-29 LAB — VARICELLA ZOSTER ANTIBODY, IGG: Varicella IgG: 71.43 Index (ref <135.00)

## 2014-08-30 LAB — HEMOGLOBINOPATHY EVALUATION
HGB A: 97.1 % (ref 96.8–97.8)
HGB S QUANTITAION: 0 %
Hemoglobin Other: 0 %
Hgb A2 Quant: 2.9 % (ref 2.2–3.2)
Hgb F Quant: 0 % (ref 0.0–2.0)

## 2014-08-31 ENCOUNTER — Other Ambulatory Visit: Payer: Self-pay | Admitting: Certified Nurse Midwife

## 2014-08-31 DIAGNOSIS — B373 Candidiasis of vulva and vagina: Secondary | ICD-10-CM

## 2014-08-31 DIAGNOSIS — B3731 Acute candidiasis of vulva and vagina: Secondary | ICD-10-CM

## 2014-08-31 LAB — SURESWAB, VAGINOSIS/VAGINITIS PLUS
Atopobium vaginae: NOT DETECTED Log (cells/mL)
C. GLABRATA, DNA: NOT DETECTED
C. PARAPSILOSIS, DNA: NOT DETECTED
C. TROPICALIS, DNA: NOT DETECTED
C. albicans, DNA: DETECTED — AB
C. trachomatis RNA, TMA: NOT DETECTED
Gardnerella vaginalis: NOT DETECTED Log (cells/mL)
LACTOBACILLUS SPECIES: 7.9 Log (cells/mL)
MEGASPHAERA SPECIES: NOT DETECTED Log (cells/mL)
N. GONORRHOEAE RNA, TMA: NOT DETECTED
T. vaginalis RNA, QL TMA: NOT DETECTED

## 2014-08-31 MED ORDER — FLUCONAZOLE 100 MG PO TABS: 100.0000 mg | ORAL_TABLET | Freq: Once | ORAL | Status: DC

## 2014-09-06 ENCOUNTER — Other Ambulatory Visit: Payer: Self-pay | Admitting: Certified Nurse Midwife

## 2014-09-06 DIAGNOSIS — Z3492 Encounter for supervision of normal pregnancy, unspecified, second trimester: Secondary | ICD-10-CM

## 2014-09-09 ENCOUNTER — Ambulatory Visit (INDEPENDENT_AMBULATORY_CARE_PROVIDER_SITE_OTHER): Payer: Medicaid Other | Admitting: Certified Nurse Midwife

## 2014-09-09 VITALS — BP 122/69 | HR 77 | Temp 98.1°F | Wt 209.0 lb

## 2014-09-09 DIAGNOSIS — Z3482 Encounter for supervision of other normal pregnancy, second trimester: Secondary | ICD-10-CM

## 2014-09-09 LAB — POCT URINALYSIS DIPSTICK
Bilirubin, UA: NEGATIVE
Blood, UA: NEGATIVE
GLUCOSE UA: NEGATIVE
KETONES UA: NEGATIVE
LEUKOCYTES UA: NEGATIVE
Nitrite, UA: NEGATIVE
Protein, UA: NEGATIVE
SPEC GRAV UA: 1.015
Urobilinogen, UA: NEGATIVE
pH, UA: 6

## 2014-09-09 NOTE — Progress Notes (Signed)
Subjective:    Dawn Benson is a 29 y.o. female being seen today for her obstetrical visit. She is at [redacted]w[redacted]d gestation. Patient reports: no complaints . Fetal movement: normal.  Problem List Items Addressed This Visit    None    Visit Diagnoses    Encounter for supervision of other normal pregnancy in second trimester    -  Primary    Relevant Orders    POCT urinalysis dipstick (Completed)      Patient Active Problem List   Diagnosis Date Noted  . Encounter for fetal anatomic survey   . [redacted] weeks gestation of pregnancy   . Limited prenatal care, antepartum 08/26/2014   Objective:    BP 122/69 mmHg  Pulse 77  Temp(Src) 98.1 F (36.7 C)  Wt 209 lb (94.802 kg)  LMP 03/25/2014 FHT: 145 BPM  Uterine Size: size equals dates     Assessment:    Pregnancy @ [redacted]w[redacted]d    Plan:    OBGCT: discussed. Signs and symptoms of preterm labor: discussed.  Labs, problem list reviewed and updated 2 hr GTT planned for next visit.   Follow up in 3 weeks.

## 2014-09-26 ENCOUNTER — Other Ambulatory Visit: Payer: Self-pay | Admitting: Certified Nurse Midwife

## 2014-09-26 ENCOUNTER — Ambulatory Visit (HOSPITAL_COMMUNITY): Payer: Medicaid Other

## 2014-09-26 ENCOUNTER — Ambulatory Visit (HOSPITAL_COMMUNITY)
Admission: RE | Admit: 2014-09-26 | Discharge: 2014-09-26 | Disposition: A | Discharge status: Home / Self Care | Payer: Medicaid Other | Source: Ambulatory Visit | Attending: Certified Nurse Midwife | Admitting: Certified Nurse Midwife

## 2014-09-26 DIAGNOSIS — O0932 Supervision of pregnancy with insufficient antenatal care, second trimester: Secondary | ICD-10-CM | POA: Insufficient documentation

## 2014-09-26 DIAGNOSIS — Z3492 Encounter for supervision of normal pregnancy, unspecified, second trimester: Secondary | ICD-10-CM

## 2014-09-26 DIAGNOSIS — Z3A26 26 weeks gestation of pregnancy: Secondary | ICD-10-CM | POA: Diagnosis not present

## 2014-09-26 DIAGNOSIS — Z36 Encounter for antenatal screening of mother: Secondary | ICD-10-CM | POA: Insufficient documentation

## 2014-09-26 DIAGNOSIS — O09892 Supervision of other high risk pregnancies, second trimester: Secondary | ICD-10-CM | POA: Insufficient documentation

## 2014-09-27 ENCOUNTER — Other Ambulatory Visit: Payer: Self-pay | Admitting: Certified Nurse Midwife

## 2014-09-27 DIAGNOSIS — Z3482 Encounter for supervision of other normal pregnancy, second trimester: Secondary | ICD-10-CM

## 2014-09-29 ENCOUNTER — Telehealth: Payer: Self-pay | Admitting: *Deleted

## 2014-09-29 ENCOUNTER — Inpatient Hospital Stay (HOSPITAL_COMMUNITY)
Admission: AD | Admit: 2014-09-29 | Discharge: 2014-09-29 | Disposition: A | Discharge status: Home / Self Care | Payer: Medicaid Other | Source: Ambulatory Visit | Attending: Obstetrics | Admitting: Obstetrics

## 2014-09-29 ENCOUNTER — Encounter (HOSPITAL_COMMUNITY): Payer: Self-pay | Admitting: *Deleted

## 2014-09-29 DIAGNOSIS — Z3A26 26 weeks gestation of pregnancy: Secondary | ICD-10-CM | POA: Insufficient documentation

## 2014-09-29 DIAGNOSIS — K529 Noninfective gastroenteritis and colitis, unspecified: Secondary | ICD-10-CM | POA: Diagnosis not present

## 2014-09-29 DIAGNOSIS — E860 Dehydration: Secondary | ICD-10-CM

## 2014-09-29 DIAGNOSIS — Z3493 Encounter for supervision of normal pregnancy, unspecified, third trimester: Secondary | ICD-10-CM | POA: Diagnosis present

## 2014-09-29 LAB — COMPREHENSIVE METABOLIC PANEL
ALBUMIN: 3.1 g/dL — AB (ref 3.5–5.0)
ALT: 10 U/L — ABNORMAL LOW (ref 14–54)
ANION GAP: 6 (ref 5–15)
AST: 15 U/L (ref 15–41)
Alkaline Phosphatase: 73 U/L (ref 38–126)
BILIRUBIN TOTAL: 0.4 mg/dL (ref 0.3–1.2)
BUN: 9 mg/dL (ref 6–20)
CHLORIDE: 107 mmol/L (ref 101–111)
CO2: 21 mmol/L — ABNORMAL LOW (ref 22–32)
CREATININE: 0.47 mg/dL (ref 0.44–1.00)
Calcium: 8.1 mg/dL — ABNORMAL LOW (ref 8.9–10.3)
GFR calc Af Amer: >60 mL/min (ref >60)
Glucose, Bld: 79 mg/dL (ref 65–99)
Potassium: 4.1 mmol/L (ref 3.5–5.1)
Sodium: 134 mmol/L — ABNORMAL LOW (ref 135–145)
Total Protein: 6.2 g/dL — ABNORMAL LOW (ref 6.5–8.1)

## 2014-09-29 LAB — URINALYSIS, ROUTINE W REFLEX MICROSCOPIC
BILIRUBIN URINE: NEGATIVE
Glucose, UA: NEGATIVE mg/dL
Hgb urine dipstick: NEGATIVE
Ketones, ur: >80 mg/dL — AB
Leukocytes, UA: NEGATIVE
NITRITE: NEGATIVE
PH: 6 (ref 5.0–8.0)
Protein, ur: NEGATIVE mg/dL
UROBILINOGEN UA: 0.2 mg/dL (ref 0.0–1.0)

## 2014-09-29 LAB — CBC
HCT: 35.2 % — ABNORMAL LOW (ref 36.0–46.0)
HEMOGLOBIN: 11.6 g/dL — AB (ref 12.0–15.0)
MCH: 28 pg (ref 26.0–34.0)
MCHC: 33 g/dL (ref 30.0–36.0)
MCV: 85 fL (ref 78.0–100.0)
Platelets: 190 10*3/uL (ref 150–400)
RBC: 4.14 MIL/uL (ref 3.87–5.11)
RDW: 13 % (ref 11.5–15.5)
WBC: 10.3 10*3/uL (ref 4.0–10.5)

## 2014-09-29 MED ORDER — ONDANSETRON 8 MG PO TBDP
8.0000 mg | ORAL_TABLET | Freq: Once | ORAL | Status: AC
  Administered 2014-09-29: 8 mg via ORAL
  Filled 2014-09-29: qty 1

## 2014-09-29 MED ORDER — MECLIZINE HCL 25 MG PO TABS: 25.0000 mg | ORAL_TABLET | Freq: Three times a day (TID) | ORAL | Status: DC | PRN

## 2014-09-29 MED ORDER — DEXTROSE 5 % IN LACTATED RINGERS IV BOLUS
1000.0000 mL | Freq: Once | INTRAVENOUS | Status: AC
  Administered 2014-09-29: 1000 mL via INTRAVENOUS

## 2014-09-29 MED ORDER — MECLIZINE HCL 25 MG PO TABS
25.0000 mg | ORAL_TABLET | Freq: Once | ORAL | Status: AC
  Administered 2014-09-29: 25 mg via ORAL
  Filled 2014-09-29: qty 1

## 2014-09-29 MED ORDER — LACTATED RINGERS IV BOLUS (SEPSIS)
1000.0000 mL | Freq: Once | INTRAVENOUS | Status: AC
  Administered 2014-09-29: 1000 mL via INTRAVENOUS

## 2014-09-29 NOTE — Telephone Encounter (Signed)
Patient states she has had diarrhea for the last 3 days. Patient states she is also having vomiting. Patient states she has had some pain in her abdomen and it has been getting "rock hard:. Patient advised to be evaluated at the MAU. Patient verbalized understanding.

## 2014-09-29 NOTE — MAU Note (Signed)
Urine in lab 

## 2014-09-29 NOTE — MAU Note (Signed)
Pt reports she has had diarrhea since yesterday. More watery today. Feeling nauseated and vomited x1. Feels weak an dizzy and c/o abd cramping and tightening.

## 2014-09-29 NOTE — MAU Provider Note (Signed)
History     CSN: [id number]  Arrival date and time: 09/29/14 1513   None     Chief Complaint  Patient presents with  . Diarrhea  . Emesis   HPI Pt is 29yo G4P1 SAB 2 at [redacted]w[redacted]d pregnant who presents with diarrhea since yesterday and nauseated and vomiting today- has not felt like eating anything Pt has felt weak and dizzy- pt has also felt like her abdomen was tightening Pt denies any spotting or bleeding RN note:   MAU Note 09/29/2014 3:39 PM    Expand All Collapse All   Pt reports she has had diarrhea since yesterday. More watery today. Feeling nauseated and vomited x1. Feels weak an dizzy and c/o abd cramping and tightening.       Past Medical History  Diagnosis Date  . Hypotension   . UTI in pregnancy   . Medical history non-contributory     Past Surgical History  Procedure Laterality Date  . Intrauterine device insertion    . No past surgeries      Family History  Problem Relation Age of Onset  . Fibroids Mother   . Hypertension Mother   . Hypertension Father     History  Substance Use Topics  . Smoking status: Never Smoker   . Smokeless tobacco: Never Used  . Alcohol Use: No    Allergies: No Known Allergies  Prescriptions prior to admission  Medication Sig Dispense Refill Last Dose  . Docosahexaenoic Acid (PRENATAL DHA PO) Take 1 tablet by mouth daily.   Taking  . fluconazole (DIFLUCAN) 100 MG tablet Take 1 tablet (100 mg total) by mouth once. Repeat dose in 48-72 hours. 3 tablet 0     Review of Systems  Constitutional: Negative for fever and chills.  Gastrointestinal: Positive for nausea, vomiting, abdominal pain and diarrhea.  Genitourinary: Negative for dysuria and urgency.  Neurological: Positive for dizziness and headaches.   Physical Exam   Blood pressure 109/67, pulse 87, temperature 98.6 F (37 C), resp. rate 18, height 5\' 6"  (1.676 m), weight 208 lb 9.6 oz (94.62 kg), last menstrual period 03/25/2014, not currently  breastfeeding.  Physical Exam  Nursing note and vitals reviewed. Constitutional: She is oriented to person, place, and time. She appears well-developed and well-nourished. No distress.  HENT:  Head: Normocephalic.  Eyes: Pupils are equal, round, and reactive to light.  Neck: Normal range of motion. Neck supple.  Cardiovascular: Normal rate.   Respiratory: Effort normal.  GI: Soft. She exhibits no distension. There is no tenderness. There is no rebound and no guarding.  Musculoskeletal: Normal range of motion.  Neurological: She is alert and oriented to person, place, and time.  Skin: Skin is warm and dry. There is pallor.  Psychiatric: She has a normal mood and affect.    MAU Course  Procedures Results for orders placed or performed during the hospital encounter of 09/29/14 (from the past 24 hour(s))  CBC     Status: Abnormal   Collection Time: 09/29/14  5:33 PM  Result Value Ref Range   WBC 10.3 4.0 - 10.5 K/uL   RBC 4.14 3.87 - 5.11 MIL/uL   Hemoglobin 11.6 (L) 12.0 - 15.0 g/dL   HCT 35.2 (L) 36.0 - 46.0 %   MCV 85.0 78.0 - 100.0 fL   MCH 28.0 26.0 - 34.0 pg   MCHC 33.0 30.0 - 36.0 g/dL   RDW 13.0 11.5 - 15.5 %   Platelets 190 150 - 400 K/uL  Results for orders placed or performed during the hospital encounter of 09/29/14 (from the past 24 hour(s))  Urinalysis, Routine w reflex microscopic (not at [facility])     Status: Abnormal   Collection Time: 09/29/14  3:45 PM  Result Value Ref Range   Color, Urine YELLOW YELLOW   APPearance CLOUDY (A) CLEAR   Specific Gravity, Urine >1.030 (H) 1.005 - 1.030   pH 6.0 5.0 - 8.0   Glucose, UA NEGATIVE NEGATIVE mg/dL   Hgb urine dipstick NEGATIVE NEGATIVE   Bilirubin Urine NEGATIVE NEGATIVE   Ketones, ur >80 (A) NEGATIVE mg/dL   Protein, ur NEGATIVE NEGATIVE mg/dL   Urobilinogen, UA 0.2 0.0 - 1.0 mg/dL   Nitrite NEGATIVE NEGATIVE   Leukocytes, UA NEGATIVE NEGATIVE  CBC     Status: Abnormal   Collection Time: 09/29/14  5:33 PM   Result Value Ref Range   WBC 10.3 4.0 - 10.5 K/uL   RBC 4.14 3.87 - 5.11 MIL/uL   Hemoglobin 11.6 (L) 12.0 - 15.0 g/dL   HCT 35.2 (L) 36.0 - 46.0 %   MCV 85.0 78.0 - 100.0 fL   MCH 28.0 26.0 - 34.0 pg   MCHC 33.0 30.0 - 36.0 g/dL   RDW 13.0 11.5 - 15.5 %   Platelets 190 150 - 400 K/uL  Comprehensive metabolic panel     Status: Abnormal   Collection Time: 09/29/14  5:33 PM  Result Value Ref Range   Sodium 134 (L) 135 - 145 mmol/L   Potassium 4.1 3.5 - 5.1 mmol/L   Chloride 107 101 - 111 mmol/L   CO2 21 (L) 22 - 32 mmol/L   Glucose, Bld 79 65 - 99 mg/dL   BUN 9 6 - 20 mg/dL   Creatinine, Ser 0.47 0.44 - 1.00 mg/dL   Calcium 8.1 (L) 8.9 - 10.3 mg/dL   Total Protein 6.2 (L) 6.5 - 8.1 g/dL   Albumin 3.1 (L) 3.5 - 5.0 g/dL   AST 15 15 - 41 U/L   ALT 10 (L) 14 - 54 U/L   Alkaline Phosphatase 73 38 - 126 U/L   Total Bilirubin 0.4 0.3 - 1.2 mg/dL   GFR calc non Af Amer >60 >60 mL/min   GFR calc Af Amer >60 >60 mL/min   Anion gap 6 5 - 15  Zofran 8mg  ODT given with relief of nausea Cold wash cloth applied to forehead for headache IV D5LR 1000 Antivert given Pt able to tolerate PO fluids Assessment and Plan  Gastroenteritis in pregnancy- Antivert B.R.A.T. Diet F/u with OB appointment  Evert Wenrich 09/29/2014, 5:03 PM

## 2014-10-03 ENCOUNTER — Encounter: Payer: Medicaid Other | Admitting: Obstetrics

## 2014-10-03 ENCOUNTER — Other Ambulatory Visit: Payer: Medicaid Other

## 2014-10-05 ENCOUNTER — Other Ambulatory Visit: Payer: Self-pay | Admitting: Certified Nurse Midwife

## 2014-10-12 ENCOUNTER — Other Ambulatory Visit: Payer: Medicaid Other

## 2014-10-12 ENCOUNTER — Encounter: Payer: Medicaid Other | Admitting: Obstetrics

## 2014-10-12 DIAGNOSIS — O2690 Pregnancy related conditions, unspecified, unspecified trimester: Secondary | ICD-10-CM | POA: Diagnosis not present

## 2014-10-13 ENCOUNTER — Encounter: Payer: Self-pay | Admitting: Obstetrics

## 2014-10-13 ENCOUNTER — Ambulatory Visit (INDEPENDENT_AMBULATORY_CARE_PROVIDER_SITE_OTHER): Payer: Medicaid Other | Admitting: Obstetrics

## 2014-10-13 ENCOUNTER — Other Ambulatory Visit: Payer: Medicaid Other

## 2014-10-13 VITALS — BP 104/64 | HR 70 | Temp 97.8°F | Wt 212.0 lb

## 2014-10-13 DIAGNOSIS — Z3483 Encounter for supervision of other normal pregnancy, third trimester: Secondary | ICD-10-CM

## 2014-10-13 LAB — POCT URINALYSIS DIPSTICK
BILIRUBIN UA: NEGATIVE
Glucose, UA: NEGATIVE
Ketones, UA: NEGATIVE
Leukocytes, UA: NEGATIVE
NITRITE UA: NEGATIVE
Protein, UA: NEGATIVE
RBC UA: NEGATIVE
Spec Grav, UA: 1.01
Urobilinogen, UA: NEGATIVE
pH, UA: 7

## 2014-10-13 LAB — CBC
HCT: 36.1 % (ref 36.0–46.0)
HEMOGLOBIN: 11.7 g/dL — AB (ref 12.0–15.0)
MCH: 27.8 pg (ref 26.0–34.0)
MCHC: 32.4 g/dL (ref 30.0–36.0)
MCV: 85.7 fL (ref 78.0–100.0)
MPV: 9 fL (ref 8.6–12.4)
PLATELETS: 243 10*3/uL (ref 150–400)
RBC: 4.21 MIL/uL (ref 3.87–5.11)
RDW: 13.4 % (ref 11.5–15.5)
WBC: 8.8 10*3/uL (ref 4.0–10.5)

## 2014-10-13 NOTE — Addendum Note (Signed)
Addended by: Lewie Loron D on: 10/13/2014 02:12 PM   Modules accepted: Orders

## 2014-10-13 NOTE — Progress Notes (Signed)
Subjective:    Dawn Benson is a 29 y.o. female being seen today for her obstetrical visit. She is at [redacted]w[redacted]d gestation. Patient reports no complaints. Fetal movement: normal.  Problem List Items Addressed This Visit    None    Visit Diagnoses    Encounter for supervision of other normal pregnancy in second trimester    -  Primary    Relevant Orders    Glucose Tolerance, 2 Hours w/1 Hour    CBC    HIV antibody    RPR    POCT urinalysis dipstick (Completed)      Patient Active Problem List   Diagnosis Date Noted  . [redacted] weeks gestation of pregnancy   . Short interval between pregnancies affecting pregnancy in second trimester, antepartum   . Encounter for fetal anatomic survey   . [redacted] weeks gestation of pregnancy   . Limited prenatal care, antepartum 08/26/2014   Objective:    BP 104/64 mmHg  Pulse 70  Temp(Src) 97.8 F (36.6 C)  Wt 212 lb (96.163 kg)  LMP 03/25/2014 FHT:  150 BPM  Uterine Size: size equals dates  Presentation: unsure     Assessment:    Pregnancy @ [redacted]w[redacted]d weeks   Plan:     labs reviewed, problem list updated Consent signed. GBS sent TDAP offered  Rhogam given for RH negative Pediatrician: discussed. Infant feeding: plans to breastfeed. Maternity leave: discussed. Cigarette smoking: never smoked. Orders Placed This Encounter  Procedures  . Glucose Tolerance, 2 Hours w/1 Hour  . CBC  . HIV antibody  . RPR  . POCT urinalysis dipstick   No orders of the defined types were placed in this encounter.   Follow up in 2 Weeks.

## 2014-10-14 LAB — RPR

## 2014-10-14 LAB — HIV ANTIBODY (ROUTINE TESTING W REFLEX): HIV: NONREACTIVE

## 2014-10-15 LAB — GLUCOSE TOLERANCE, 2 HOURS W/ 1HR
Glucose, 1 hour: 97 mg/dL (ref 70–170)
Glucose, Fasting: 63 mg/dL — ABNORMAL LOW (ref 70–99)

## 2014-10-27 ENCOUNTER — Encounter: Payer: Medicaid Other | Admitting: Obstetrics

## 2014-10-31 ENCOUNTER — Ambulatory Visit (INDEPENDENT_AMBULATORY_CARE_PROVIDER_SITE_OTHER): Payer: Medicaid Other | Admitting: Obstetrics

## 2014-10-31 ENCOUNTER — Encounter: Payer: Self-pay | Admitting: Obstetrics

## 2014-10-31 VITALS — BP 112/70 | HR 81 | Temp 98.4°F | Wt 215.0 lb

## 2014-10-31 DIAGNOSIS — Z3483 Encounter for supervision of other normal pregnancy, third trimester: Secondary | ICD-10-CM | POA: Diagnosis not present

## 2014-10-31 LAB — POCT URINALYSIS DIPSTICK
Bilirubin, UA: NEGATIVE
Blood, UA: NEGATIVE
Glucose, UA: NEGATIVE
Ketones, UA: NEGATIVE
LEUKOCYTES UA: NEGATIVE
Nitrite, UA: NEGATIVE
Protein, UA: NEGATIVE
SPEC GRAV UA: 1.01
Urobilinogen, UA: NEGATIVE
pH, UA: 7

## 2014-10-31 NOTE — Progress Notes (Signed)
Subjective:    Dawn Benson is a 29 y.o. female being seen today for her obstetrical visit. She is at [redacted]w[redacted]d gestation. Patient reports no complaints. Fetal movement: normal.  Problem List Items Addressed This Visit    None    Visit Diagnoses    Encounter for supervision of other normal pregnancy in third trimester    -  Primary    Relevant Orders    POCT urinalysis dipstick (Completed)      Patient Active Problem List   Diagnosis Date Noted  . [redacted] weeks gestation of pregnancy   . Short interval between pregnancies affecting pregnancy in second trimester, antepartum   . Encounter for fetal anatomic survey   . [redacted] weeks gestation of pregnancy   . Limited prenatal care, antepartum 08/26/2014   Objective:    BP 112/70 mmHg  Pulse 81  Temp(Src) 98.4 F (36.9 C)  Wt 215 lb (97.523 kg)  LMP 03/25/2014 FHT:  150 BPM  Uterine Size: size equals dates  Presentation: unsure     Assessment:    Pregnancy @ [redacted]w[redacted]d weeks   Plan:     labs reviewed, problem list updated Consent signed. GBS sent TDAP offered  Rhogam given for RH negative Pediatrician: discussed. Infant feeding: plans to breastfeed. Maternity leave: discussed. Cigarette smoking: never smoked. Orders Placed This Encounter  Procedures  . POCT urinalysis dipstick   No orders of the defined types were placed in this encounter.   Follow up in 2 Weeks.

## 2014-11-07 ENCOUNTER — Encounter: Payer: Medicaid Other | Admitting: Obstetrics

## 2014-11-10 ENCOUNTER — Ambulatory Visit: Payer: Medicaid Other

## 2014-11-14 ENCOUNTER — Telehealth: Payer: Self-pay | Admitting: Obstetrics

## 2014-11-14 ENCOUNTER — Encounter: Payer: Medicaid Other | Admitting: Obstetrics

## 2014-11-18 NOTE — Telephone Encounter (Signed)
11/18/2014 - no return calls from patient. brm

## 2014-11-21 ENCOUNTER — Other Ambulatory Visit: Payer: Self-pay | Admitting: Certified Nurse Midwife

## 2014-11-21 ENCOUNTER — Ambulatory Visit (HOSPITAL_COMMUNITY)
Admission: RE | Admit: 2014-11-21 | Discharge: 2014-11-21 | Disposition: A | Discharge status: Home / Self Care | Payer: Medicaid Other | Source: Ambulatory Visit | Attending: Certified Nurse Midwife | Admitting: Certified Nurse Midwife

## 2014-11-21 DIAGNOSIS — Z3A34 34 weeks gestation of pregnancy: Secondary | ICD-10-CM

## 2014-11-21 DIAGNOSIS — O0933 Supervision of pregnancy with insufficient antenatal care, third trimester: Secondary | ICD-10-CM

## 2014-11-21 DIAGNOSIS — Z3482 Encounter for supervision of other normal pregnancy, second trimester: Secondary | ICD-10-CM | POA: Insufficient documentation

## 2014-11-21 DIAGNOSIS — D279 Benign neoplasm of unspecified ovary: Secondary | ICD-10-CM

## 2014-11-21 DIAGNOSIS — O3483 Maternal care for other abnormalities of pelvic organs, third trimester: Secondary | ICD-10-CM

## 2014-11-22 ENCOUNTER — Other Ambulatory Visit: Payer: Self-pay | Admitting: Certified Nurse Midwife

## 2014-11-23 ENCOUNTER — Ambulatory Visit (INDEPENDENT_AMBULATORY_CARE_PROVIDER_SITE_OTHER): Payer: Medicaid Other | Admitting: Obstetrics

## 2014-11-23 ENCOUNTER — Encounter: Payer: Self-pay | Admitting: Obstetrics

## 2014-11-23 VITALS — BP 121/76 | HR 76 | Temp 98.0°F | Wt 217.3 lb

## 2014-11-23 DIAGNOSIS — K219 Gastro-esophageal reflux disease without esophagitis: Secondary | ICD-10-CM

## 2014-11-23 DIAGNOSIS — Z3483 Encounter for supervision of other normal pregnancy, third trimester: Secondary | ICD-10-CM

## 2014-11-23 LAB — POCT URINALYSIS DIPSTICK
BILIRUBIN UA: NEGATIVE
Blood, UA: NEGATIVE
GLUCOSE UA: NEGATIVE
Ketones, UA: NEGATIVE
Nitrite, UA: NEGATIVE
Protein, UA: NEGATIVE
Spec Grav, UA: 1.015
Urobilinogen, UA: NEGATIVE
pH, UA: 7

## 2014-11-23 MED ORDER — OMEPRAZOLE 20 MG PO CPDR: 20.0000 mg | DELAYED_RELEASE_CAPSULE | Freq: Two times a day (BID) | ORAL | Status: DC

## 2014-11-23 NOTE — Addendum Note (Signed)
Addended by: Willia Craze on: 11/23/2014 04:31 PM   Modules accepted: Orders

## 2014-11-23 NOTE — Progress Notes (Signed)
Subjective:    Dawn Benson is a 29 y.o. female being seen today for her obstetrical visit. She is at [redacted]w[redacted]d gestation. Patient reports heartburn and dizziness. Fetal movement: normal.  Problem List Items Addressed This Visit    None    Visit Diagnoses    GERD without esophagitis    -  Primary    Relevant Medications    omeprazole (PRILOSEC) 20 MG capsule      Patient Active Problem List   Diagnosis Date Noted  . [redacted] weeks gestation of pregnancy   . Short interval between pregnancies affecting pregnancy in second trimester, antepartum   . Encounter for fetal anatomic survey   . [redacted] weeks gestation of pregnancy   . Limited prenatal care, antepartum 08/26/2014   Objective:    BP 121/76 mmHg  Pulse 76  Temp(Src) 98 F (36.7 C)  Wt 217 lb 4.8 oz (98.567 kg)  LMP 03/25/2014 FHT:  150 BPM  Uterine Size: size less than dates  Presentation: unsure     Assessment:    Pregnancy @ [redacted]w[redacted]d weeks    Heartburn  Plan:     labs reviewed, problem list updated Consent signed. GBS sent TDAP offered  Rhogam given for RH negative Pediatrician: discussed. Infant feeding: plans to breastfeed. Maternity leave: discussed. Cigarette smoking: never smoked. No orders of the defined types were placed in this encounter.   Meds ordered this encounter  Medications  . omeprazole (PRILOSEC) 20 MG capsule    Sig: Take 1 capsule (20 mg total) by mouth 2 (two) times daily before a meal.    Dispense:  60 capsule    Refill:  5   Follow up in 1 Week.

## 2014-11-30 ENCOUNTER — Ambulatory Visit (INDEPENDENT_AMBULATORY_CARE_PROVIDER_SITE_OTHER): Payer: Medicaid Other | Admitting: Certified Nurse Midwife

## 2014-11-30 ENCOUNTER — Telehealth: Payer: Self-pay | Admitting: Obstetrics

## 2014-11-30 VITALS — BP 114/79 | HR 96 | Temp 97.6°F | Wt 214.1 lb

## 2014-11-30 DIAGNOSIS — O09893 Supervision of other high risk pregnancies, third trimester: Secondary | ICD-10-CM

## 2014-11-30 LAB — POCT URINALYSIS DIPSTICK
BILIRUBIN UA: NEGATIVE
Blood, UA: NEGATIVE
GLUCOSE UA: NORMAL
Ketones, UA: NEGATIVE
Leukocytes, UA: NEGATIVE
NITRITE UA: NEGATIVE
Protein, UA: NEGATIVE
Spec Grav, UA: 1.015
Urobilinogen, UA: NEGATIVE
pH, UA: 6

## 2014-11-30 NOTE — Progress Notes (Signed)
Subjective:    Dawn Benson is a 29 y.o. female being seen today for her obstetrical visit. She is at [redacted]w[redacted]d gestation. Patient reports backache, no bleeding, no leaking and occasional contractions. Fetal movement: normal.  Problem List Items Addressed This Visit    None    Visit Diagnoses    Supervision of other high risk pregnancy, antepartum, third trimester    -  Primary    Relevant Orders    POCT urinalysis dipstick (Completed)      Patient Active Problem List   Diagnosis Date Noted  . [redacted] weeks gestation of pregnancy   . Short interval between pregnancies affecting pregnancy in second trimester, antepartum   . Encounter for fetal anatomic survey   . [redacted] weeks gestation of pregnancy   . Limited prenatal care, antepartum 08/26/2014   Objective:    BP 114/79 mmHg  Pulse 96  Temp(Src) 97.6 F (36.4 C)  Wt 214 lb 1.6 oz (97.115 kg)  LMP 03/25/2014 FHT:  145 BPM  Uterine Size: size equals dates  Presentation: cephalic   Cervix:  1.5 cm, 50% effaced, ballotable  Assessment:    Pregnancy @ [redacted]w[redacted]d weeks   Doing well  Plan:     labs reviewed, problem list updated Consent signed. GBS sent TDAP offered  Rhogam given for RH negative Pediatrician: discussed. Infant feeding: plans to breastfeed. Maternity leave: discussed. Cigarette smoking: never smoked. Orders Placed This Encounter  Procedures  . POCT urinalysis dipstick   No orders of the defined types were placed in this encounter.   Follow up in 1 Week.

## 2014-11-30 NOTE — Addendum Note (Signed)
Addended by: Carole Binning on: 11/30/2014 04:53 PM   Modules accepted: Orders

## 2014-11-30 NOTE — Addendum Note (Signed)
Addended by: Carole Binning on: 11/30/2014 04:55 PM   Modules accepted: Orders

## 2014-12-02 LAB — STREP B DNA PROBE: GBSP: NOT DETECTED

## 2014-12-02 NOTE — Telephone Encounter (Signed)
CLOSE ENCOUNTER °

## 2014-12-07 ENCOUNTER — Ambulatory Visit (INDEPENDENT_AMBULATORY_CARE_PROVIDER_SITE_OTHER): Payer: Medicaid Other | Admitting: Certified Nurse Midwife

## 2014-12-07 ENCOUNTER — Encounter (HOSPITAL_COMMUNITY): Payer: Self-pay | Admitting: *Deleted

## 2014-12-07 ENCOUNTER — Inpatient Hospital Stay (HOSPITAL_COMMUNITY)
Admission: AD | Admit: 2014-12-07 | Discharge: 2014-12-07 | Disposition: A | Discharge status: Home / Self Care | Payer: Medicaid Other | Source: Ambulatory Visit | Attending: Obstetrics | Admitting: Obstetrics

## 2014-12-07 VITALS — BP 113/75 | HR 86 | Wt 215.0 lb

## 2014-12-07 DIAGNOSIS — Z3483 Encounter for supervision of other normal pregnancy, third trimester: Secondary | ICD-10-CM

## 2014-12-07 DIAGNOSIS — Z3493 Encounter for supervision of normal pregnancy, unspecified, third trimester: Secondary | ICD-10-CM | POA: Diagnosis present

## 2014-12-07 LAB — POCT URINALYSIS DIPSTICK
Bilirubin, UA: NEGATIVE
Blood, UA: NEGATIVE
Glucose, UA: NEGATIVE
KETONES UA: NEGATIVE
LEUKOCYTES UA: NEGATIVE
Nitrite, UA: NEGATIVE
Protein, UA: NEGATIVE
Spec Grav, UA: 1.015
Urobilinogen, UA: NEGATIVE
pH, UA: 6

## 2014-12-07 NOTE — Progress Notes (Signed)
Subjective:    Dawn Benson is a 29 y.o. female being seen today for her obstetrical visit. She is at [redacted]w[redacted]d gestation. Patient reports no bleeding, no leaking and occasional contractions.  Contractions are waking her up at night, encouraged fluids and Tylenol.  Fetal movement: normal.  Problem List Items Addressed This Visit    None    Visit Diagnoses    Encounter for supervision of other normal pregnancy in third trimester    -  Primary    Relevant Orders    POCT urinalysis dipstick      Patient Active Problem List   Diagnosis Date Noted  . [redacted] weeks gestation of pregnancy   . Short interval between pregnancies affecting pregnancy in second trimester, antepartum   . Encounter for fetal anatomic survey   . [redacted] weeks gestation of pregnancy   . Limited prenatal care, antepartum 08/26/2014   Objective:    BP 113/75 mmHg  Pulse 86  Wt 215 lb (97.523 kg)  LMP 03/25/2014 FHT:  136 BPM  Uterine Size: size equals dates  Presentation: cephalic     Assessment:    Pregnancy @ [redacted]w[redacted]d weeks   Doing well  Plan:     labs reviewed, problem list updated Consent signed. GBS reviewed  TDAP offered  Rhogam given for RH negative Pediatrician: discussed. Infant feeding: plans to breastfeed. Maternity leave: N/A. Cigarette smoking: never smoked. Orders Placed This Encounter  Procedures  . POCT urinalysis dipstick   No orders of the defined types were placed in this encounter.   Follow up in 1 Week.

## 2014-12-07 NOTE — Discharge Instructions (Signed)
Fetal Movement Counts °Patient Name: __________________________________________________ Patient Due Date: ____________________ °Performing a fetal movement count is highly recommended in high-risk pregnancies, but it is good for every pregnant woman to do. Your health care provider may ask you to start counting fetal movements at 28 weeks of the pregnancy. Fetal movements often increase: °· After eating a full meal. °· After physical activity. °· After eating or drinking something sweet or cold. °· At rest. °Pay attention to when you feel the baby is most active. This will help you notice a pattern of your baby's sleep and wake cycles and what factors contribute to an increase in fetal movement. It is important to perform a fetal movement count at the same time each day when your baby is normally most active.  °HOW TO COUNT FETAL MOVEMENTS °1. Find a quiet and comfortable area to sit or lie down on your left side. Lying on your left side provides the best blood and oxygen circulation to your baby. °2. Write down the day and time on a sheet of paper or in a journal. °3. Start counting kicks, flutters, swishes, rolls, or jabs in a 2-hour period. You should feel at least 10 movements within 2 hours. °4. If you do not feel 10 movements in 2 hours, wait 2-3 hours and count again. Look for a change in the pattern or not enough counts in 2 hours. °SEEK MEDICAL CARE IF: °· You feel less than 10 counts in 2 hours, tried twice. °· There is no movement in over an hour. °· The pattern is changing or taking longer each day to reach 10 counts in 2 hours. °· You feel the baby is not moving as he or she usually does. °Date: ____________ Movements: ____________ Start time: ____________ Finish time: ____________  °Date: ____________ Movements: ____________ Start time: ____________ Finish time: ____________ °Date: ____________ Movements: ____________ Start time: ____________ Finish time: ____________ °Date: ____________ Movements:  ____________ Start time: ____________ Finish time: ____________ °Date: ____________ Movements: ____________ Start time: ____________ Finish time: ____________ °Date: ____________ Movements: ____________ Start time: ____________ Finish time: ____________ °Date: ____________ Movements: ____________ Start time: ____________ Finish time: ____________ °Date: ____________ Movements: ____________ Start time: ____________ Finish time: ____________  °Date: ____________ Movements: ____________ Start time: ____________ Finish time: ____________ °Date: ____________ Movements: ____________ Start time: ____________ Finish time: ____________ °Date: ____________ Movements: ____________ Start time: ____________ Finish time: ____________ °Date: ____________ Movements: ____________ Start time: ____________ Finish time: ____________ °Date: ____________ Movements: ____________ Start time: ____________ Finish time: ____________ °Date: ____________ Movements: ____________ Start time: ____________ Finish time: ____________ °Date: ____________ Movements: ____________ Start time: ____________ Finish time: ____________  °Date: ____________ Movements: ____________ Start time: ____________ Finish time: ____________ °Date: ____________ Movements: ____________ Start time: ____________ Finish time: ____________ °Date: ____________ Movements: ____________ Start time: ____________ Finish time: ____________ °Date: ____________ Movements: ____________ Start time: ____________ Finish time: ____________ °Date: ____________ Movements: ____________ Start time: ____________ Finish time: ____________ °Date: ____________ Movements: ____________ Start time: ____________ Finish time: ____________ °Date: ____________ Movements: ____________ Start time: ____________ Finish time: ____________  °Date: ____________ Movements: ____________ Start time: ____________ Finish time: ____________ °Date: ____________ Movements: ____________ Start time: ____________ Finish  time: ____________ °Date: ____________ Movements: ____________ Start time: ____________ Finish time: ____________ °Date: ____________ Movements: ____________ Start time: ____________ Finish time: ____________ °Date: ____________ Movements: ____________ Start time: ____________ Finish time: ____________ °Date: ____________ Movements: ____________ Start time: ____________ Finish time: ____________ °Date: ____________ Movements: ____________ Start time: ____________ Finish time: ____________  °Date: ____________ Movements: ____________ Start time: ____________ Finish   time: ____________ °Date: ____________ Movements: ____________ Start time: ____________ Finish time: ____________ °Date: ____________ Movements: ____________ Start time: ____________ Finish time: ____________ °Date: ____________ Movements: ____________ Start time: ____________ Finish time: ____________ °Date: ____________ Movements: ____________ Start time: ____________ Finish time: ____________ °Date: ____________ Movements: ____________ Start time: ____________ Finish time: ____________ °Date: ____________ Movements: ____________ Start time: ____________ Finish time: ____________  °Date: ____________ Movements: ____________ Start time: ____________ Finish time: ____________ °Date: ____________ Movements: ____________ Start time: ____________ Finish time: ____________ °Date: ____________ Movements: ____________ Start time: ____________ Finish time: ____________ °Date: ____________ Movements: ____________ Start time: ____________ Finish time: ____________ °Date: ____________ Movements: ____________ Start time: ____________ Finish time: ____________ °Date: ____________ Movements: ____________ Start time: ____________ Finish time: ____________ °Date: ____________ Movements: ____________ Start time: ____________ Finish time: ____________  °Date: ____________ Movements: ____________ Start time: ____________ Finish time: ____________ °Date: ____________  Movements: ____________ Start time: ____________ Finish time: ____________ °Date: ____________ Movements: ____________ Start time: ____________ Finish time: ____________ °Date: ____________ Movements: ____________ Start time: ____________ Finish time: ____________ °Date: ____________ Movements: ____________ Start time: ____________ Finish time: ____________ °Date: ____________ Movements: ____________ Start time: ____________ Finish time: ____________ °Date: ____________ Movements: ____________ Start time: ____________ Finish time: ____________  °Date: ____________ Movements: ____________ Start time: ____________ Finish time: ____________ °Date: ____________ Movements: ____________ Start time: ____________ Finish time: ____________ °Date: ____________ Movements: ____________ Start time: ____________ Finish time: ____________ °Date: ____________ Movements: ____________ Start time: ____________ Finish time: ____________ °Date: ____________ Movements: ____________ Start time: ____________ Finish time: ____________ °Date: ____________ Movements: ____________ Start time: ____________ Finish time: ____________ °Document Released: 04/24/2006 Document Revised: 08/09/2013 Document Reviewed: 01/20/2012 °[employer]® Patient Information ©2015 [employer]. This information is not intended to replace advice given to you by your health care provider. Make sure you discuss any questions you have with your health care provider. °Braxton Hicks Contractions °Contractions of the uterus can occur throughout pregnancy. Contractions are not always a sign that you are in labor.  °WHAT ARE BRAXTON HICKS CONTRACTIONS?  °Contractions that occur before labor are called Braxton Hicks contractions, or false labor. Toward the end of pregnancy (32-34 weeks), these contractions can develop more often and may become more forceful. This is not true labor because these contractions do not result in opening (dilatation) and thinning of the cervix. They  are sometimes difficult to tell apart from true labor because these contractions can be forceful and people have different pain tolerances. You should not feel embarrassed if you go to the hospital with false labor. Sometimes, the only way to tell if you are in true labor is for your health care provider to look for changes in the cervix. °If there are no prenatal problems or other health problems associated with the pregnancy, it is completely safe to be sent home with false labor and await the onset of true labor. °HOW CAN YOU TELL THE DIFFERENCE BETWEEN TRUE AND FALSE LABOR? °False Labor °· The contractions of false labor are usually shorter and not as hard as those of true labor.   °· The contractions are usually irregular.   °· The contractions are often felt in the front of the lower abdomen and in the groin.   °· The contractions may go away when you walk around or change positions while lying down.   °· The contractions get weaker and are shorter lasting as time goes on.   °· The contractions do not usually become progressively stronger, regular, and closer together as with true labor.   °True Labor °5. Contractions in true labor last 30-70 seconds, become   very regular, usually become more intense, and increase in frequency.   °6. The contractions do not go away with walking.   °7. The discomfort is usually felt in the top of the uterus and spreads to the lower abdomen and low back.   °8. True labor can be determined by your health care provider with an exam. This will show that the cervix is dilating and getting thinner.   °WHAT TO REMEMBER °· Keep up with your usual exercises and follow other instructions given by your health care provider.   °· Take medicines as directed by your health care provider.   °· Keep your regular prenatal appointments.   °· Eat and drink lightly if you think you are going into labor.   °· If Braxton Hicks contractions are making you uncomfortable:   °· Change your position from  lying down or resting to walking, or from walking to resting.   °· Sit and rest in a tub of warm water.   °· Drink 2-3 glasses of water. Dehydration may cause these contractions.   °· Do slow and deep breathing several times an hour.   °WHEN SHOULD I SEEK IMMEDIATE MEDICAL CARE? °Seek immediate medical care if: °· Your contractions become stronger, more regular, and closer together.   °· You have fluid leaking or gushing from your vagina.   °· You have a fever.   °· You pass blood-tinged mucus.   °· You have vaginal bleeding.   °· You have continuous abdominal pain.   °· You have low back pain that you never had before.   °· You feel your baby's head pushing down and causing pelvic pressure.   °· Your baby is not moving as much as it used to.   °Document Released: 03/25/2005 Document Revised: 03/30/2013 Document Reviewed: 01/04/2013 °[employer]® Patient Information ©2015 [employer]. This information is not intended to replace advice given to you by your health care provider. Make sure you discuss any questions you have with your health care provider. ° °

## 2014-12-07 NOTE — MAU Note (Signed)
Pt reports contractions all day , now 5-7 minutes apart. Denies bleeding or ROM

## 2014-12-07 NOTE — MAU Note (Signed)
Pt. States she has been contracting all day and this evening contractions became closer 5-28mins. Denies LOF or bleeding. Pt. States normal discharge and no change in this for the past few weeks. Pt. Reports positive baby movement. Next appointment is Tuesday. Was seen in office this am and was not checked at this time.

## 2014-12-13 ENCOUNTER — Ambulatory Visit (INDEPENDENT_AMBULATORY_CARE_PROVIDER_SITE_OTHER): Payer: Medicaid Other | Admitting: Certified Nurse Midwife

## 2014-12-13 VITALS — BP 112/75 | HR 83 | Temp 97.1°F | Wt 219.0 lb

## 2014-12-13 DIAGNOSIS — Z3483 Encounter for supervision of other normal pregnancy, third trimester: Secondary | ICD-10-CM

## 2014-12-13 LAB — POCT URINALYSIS DIPSTICK
Bilirubin, UA: NEGATIVE
GLUCOSE UA: NEGATIVE
Ketones, UA: NEGATIVE
Leukocytes, UA: NEGATIVE
Nitrite, UA: NEGATIVE
Protein, UA: NEGATIVE
RBC UA: NEGATIVE
SPEC GRAV UA: 1.015
UROBILINOGEN UA: NEGATIVE
pH, UA: 6

## 2014-12-13 NOTE — Progress Notes (Signed)
Subjective:    Dawn Benson is a 29 y.o. female being seen today for her obstetrical visit. She is at [redacted]w[redacted]d gestation. Patient reports backache, no bleeding, no leaking and occasional contractions. Fetal movement: normal.  Problem List Items Addressed This Visit    None    Visit Diagnoses    Encounter for supervision of other normal pregnancy in third trimester    -  Primary    Relevant Orders    POCT urinalysis dipstick      Patient Active Problem List   Diagnosis Date Noted  . [redacted] weeks gestation of pregnancy   . Short interval between pregnancies affecting pregnancy in second trimester, antepartum   . Encounter for fetal anatomic survey   . [redacted] weeks gestation of pregnancy   . Limited prenatal care, antepartum 08/26/2014    Objective:    BP 112/75 mmHg  Pulse 83  Temp(Src) 97.1 F (36.2 C)  Wt 219 lb (99.338 kg)  LMP 03/25/2014 FHT: 140 BPM  Uterine Size: size equals dates  Presentations: cephalic  Pelvic Exam:              Dilation: 2cm       Effacement: 50%             Station:  -3    Consistency: soft            Position: middle     Assessment:    Pregnancy @ [redacted]w[redacted]d weeks   Doing well  Plan:   Plans for delivery: Vaginal anticipated; labs reviewed; problem list updated Counseling: Consent signed. Infant feeding: plans to breastfeed. Cigarette smoking: never smoked. L&D discussion: symptoms of labor, discussed when to call, discussed what number to call, anesthetic/analgesic options reviewed and delivering clinician:  plans Certified Nurse-Midwife. Postpartum supports and preparation: circumcision discussed and contraception plans discussed.  Follow up in 1 Week.

## 2014-12-20 ENCOUNTER — Inpatient Hospital Stay (HOSPITAL_COMMUNITY)
Admission: AD | Admit: 2014-12-20 | Discharge: 2014-12-20 | Disposition: A | Discharge status: Home / Self Care | Payer: Medicaid Other | Source: Ambulatory Visit | Attending: Obstetrics | Admitting: Obstetrics

## 2014-12-20 ENCOUNTER — Encounter (HOSPITAL_COMMUNITY): Payer: Self-pay | Admitting: *Deleted

## 2014-12-20 ENCOUNTER — Encounter: Payer: Self-pay | Admitting: Certified Nurse Midwife

## 2014-12-20 DIAGNOSIS — O368131 Decreased fetal movements, third trimester, fetus 1: Secondary | ICD-10-CM | POA: Diagnosis not present

## 2014-12-20 DIAGNOSIS — O368130 Decreased fetal movements, third trimester, not applicable or unspecified: Secondary | ICD-10-CM | POA: Diagnosis not present

## 2014-12-20 DIAGNOSIS — Z3A38 38 weeks gestation of pregnancy: Secondary | ICD-10-CM | POA: Insufficient documentation

## 2014-12-20 NOTE — MAU Provider Note (Signed)
History   [id number]   Chief Complaint  Patient presents with  . Decreased Fetal Movement    HPI Dawn Benson is a 29 y.o. female  [phone] here with report of decreased fetal movement since 0700.  Reports feeling the baby move approximately 1-2 times this morning.  Denies vaginal bleeding or leaking of fluid.  Felt irregular contractions last night; none today.   Patient's last menstrual period was 03/25/2014.  OB History  Gravida Para Term Preterm AB SAB TAB Ectopic Multiple Living  4 1 1  2 2    1     # Outcome Date GA Lbr Len/2nd Weight Sex Delivery Anes PTL Lv  4 Current           3 Term 12/09/13 [redacted]w[redacted]d 11:06 / 01:53 3.36 kg (7 lb 6.5 oz) F Vag-Spont Local  Y  2 SAB 2014 [redacted]w[redacted]d       N  1 SAB 2010 [redacted]w[redacted]d       N      Past Medical History  Diagnosis Date  . Hypotension   . UTI in pregnancy   . Medical history non-contributory     Family History  Problem Relation Age of Onset  . Fibroids Mother   . Hypertension Mother   . Hypertension Father     Social History   Social History  . Marital Status: Married    Spouse Name: N/A  . Number of Children: N/A  . Years of Education: N/A   Social History Main Topics  . Smoking status: Never Smoker   . Smokeless tobacco: Never Used  . Alcohol Use: No  . Drug Use: No  . Sexual Activity: Not Currently     Comment: last intercourse was months ago.    Other Topics Concern  . None   Social History Narrative    No Known Allergies  No current facility-administered medications on file prior to encounter.   Current Outpatient Prescriptions on File Prior to Encounter  Medication Sig Dispense Refill  . Docosahexaenoic Acid (PRENATAL DHA PO) Take 1 tablet by mouth daily.     . Prenatal Vit-Fe Fumarate-FA (PRENATAL MULTIVITAMIN) TABS tablet Take 1 tablet by mouth daily at 12 noon.    . meclizine (ANTIVERT) 25 MG tablet Take 1 tablet (25 mg total) by mouth 3 (three) times daily as needed for dizziness. (Patient not taking:  Reported on 12/07/2014) 30 tablet 0  . Menthol-Methyl Salicylate (ICY HOT) 29-93 % STCK Apply 1 application topically daily as needed (lower back and leg pain).    Marland Kitchen omeprazole (PRILOSEC) 20 MG capsule Take 1 capsule (20 mg total) by mouth 2 (two) times daily before a meal. (Patient not taking: Reported on 12/07/2014) 60 capsule 5  . Tetrahydroz-Glyc-Hyprom-PEG (VISINE MAXIMUM REDNESS RELIEF OP) Place 1-2 drops into both eyes daily as needed (relieve redness in eyes).       Review of Systems  Constitutional: Negative.   Gastrointestinal: Negative.   Genitourinary: Negative.      Physical Exam   Filed Vitals:   12/20/14 1134 12/20/14 1242  BP: 112/69 117/68  Pulse: 76 72  Temp: 97.7 F (36.5 C) 98.1 F (36.7 C)  TempSrc: Oral   Resp: 16 18    Physical Exam  Nursing note and vitals reviewed. Constitutional: She is oriented to person, place, and time. She appears well-developed and well-nourished. No distress.  HENT:  Head: Normocephalic and atraumatic.  Eyes: Conjunctivae are normal. Right eye exhibits no discharge. Left eye exhibits no discharge.  No scleral icterus.  Neck: Normal range of motion.  Respiratory: Effort normal. No respiratory distress.  GI: Soft. There is no tenderness.  Neurological: She is alert and oriented to person, place, and time.  Skin: Skin is warm and dry. She is not diaphoretic.  Psychiatric: She has a normal mood and affect. Her behavior is normal. Judgment and thought content normal.    Fetal Tracing:  Baseline: 125 Variability: moderate Accelerations: 15x15 Decelerations: none  Toco: x1, 60    MAU Course  Procedures  MDM Reactive tracing; pt reports feeling movement since being on monitor 1241-C/w Dr. Jodi Mourning. Discussed DFM & reactive tracing. Discharge home.   Assessment and Plan  A: 1. Decreased fetal movement, third trimester, fetus 1    P: Discharge home Keep f/u with [facility] Discussed reasons to return to MAU Fetal kick  count form given   Jorje Guild, NP 12/20/2014 12:14 PM

## 2014-12-20 NOTE — Progress Notes (Signed)
Robyne Askew NP in to see pt and discuss d/c plan. Written and verbal d/c instructions given and understanding voiced.

## 2014-12-20 NOTE — MAU Note (Signed)
Pt C/O decreased FM since 0700, moved 1-2 times all morning.  Had uc's in her back last night, none now.  Denies bleeding or LOF.

## 2014-12-20 NOTE — Discharge Instructions (Signed)

## 2014-12-21 ENCOUNTER — Ambulatory Visit (INDEPENDENT_AMBULATORY_CARE_PROVIDER_SITE_OTHER): Payer: Medicaid Other | Admitting: Certified Nurse Midwife

## 2014-12-21 VITALS — Wt 220.0 lb

## 2014-12-21 DIAGNOSIS — Z3483 Encounter for supervision of other normal pregnancy, third trimester: Secondary | ICD-10-CM

## 2014-12-21 DIAGNOSIS — O368131 Decreased fetal movements, third trimester, fetus 1: Secondary | ICD-10-CM

## 2014-12-21 LAB — POCT URINALYSIS DIPSTICK
Bilirubin, UA: NEGATIVE
Glucose, UA: NEGATIVE
Ketones, UA: NEGATIVE
Leukocytes, UA: NEGATIVE
NITRITE UA: NEGATIVE
PH UA: 6.5
PROTEIN UA: NEGATIVE
RBC UA: NEGATIVE
Spec Grav, UA: 1.015
Urobilinogen, UA: NEGATIVE

## 2014-12-21 NOTE — Progress Notes (Signed)
Subjective:    Dawn Benson is a 29 y.o. female being seen today for her obstetrical visit. She is at [redacted]w[redacted]d gestation. Patient reports no bleeding, no cramping and occasional contractions. Fetal movement: decreased.  Had been to triage for decreased fetal movement yesterday.    Problem List Items Addressed This Visit    None    Visit Diagnoses    Encounter for supervision of other normal pregnancy in third trimester    -  Primary    Relevant Orders    POCT urinalysis dipstick (Completed)      Patient Active Problem List   Diagnosis Date Noted  . [redacted] weeks gestation of pregnancy   . Short interval between pregnancies affecting pregnancy in second trimester, antepartum   . Encounter for fetal anatomic survey   . [redacted] weeks gestation of pregnancy   . Limited prenatal care, antepartum 08/26/2014    Objective:    Wt 220 lb (99.791 kg)  LMP 03/25/2014 FHT: 135 BPM  Uterine Size: size equals dates  Presentations: cephalic  Pelvic Exam:              Dilation: 1cm       Effacement: Long             Station:  Floating    Consistency: soft            Position: posterior   NST: Cat. 1, no decels, +accels, no contractions on monitor.    Assessment:    Pregnancy @ [redacted]w[redacted]d weeks   Reactive NST Plan:   Plans for delivery: Vaginal anticipated; labs reviewed; problem list updated Counseling: Consent signed. Infant feeding: plans to breastfeed. Cigarette smoking: never smoked. L&D discussion: symptoms of labor, discussed when to call, discussed what number to call, anesthetic/analgesic options reviewed and delivering clinician:  plans no preference. Postpartum supports and preparation: circumcision discussed and contraception plans discussed.  Follow up on Tuesday with IOL.

## 2014-12-23 ENCOUNTER — Telehealth (HOSPITAL_COMMUNITY): Payer: Self-pay | Admitting: *Deleted

## 2014-12-23 NOTE — Telephone Encounter (Signed)
Preadmission screen  

## 2014-12-27 ENCOUNTER — Inpatient Hospital Stay (HOSPITAL_COMMUNITY): Admission: RE | Admit: 2014-12-27 | Payer: Medicaid Other | Source: Ambulatory Visit

## 2014-12-28 ENCOUNTER — Inpatient Hospital Stay (HOSPITAL_COMMUNITY)
Admission: RE | Admit: 2014-12-28 | Discharge: 2014-12-30 | DRG: 774 | Disposition: A | Discharge status: Home / Self Care | Payer: Medicaid Other | Source: Ambulatory Visit | Attending: Obstetrics | Admitting: Obstetrics

## 2014-12-28 ENCOUNTER — Encounter (HOSPITAL_COMMUNITY): Payer: Self-pay

## 2014-12-28 DIAGNOSIS — Z3A00 Weeks of gestation of pregnancy not specified: Secondary | ICD-10-CM | POA: Diagnosis present

## 2014-12-28 DIAGNOSIS — Z8249 Family history of ischemic heart disease and other diseases of the circulatory system: Secondary | ICD-10-CM

## 2014-12-28 DIAGNOSIS — I959 Hypotension, unspecified: Secondary | ICD-10-CM | POA: Diagnosis present

## 2014-12-28 DIAGNOSIS — Z842 Family history of other diseases of the genitourinary system: Secondary | ICD-10-CM | POA: Diagnosis not present

## 2014-12-28 DIAGNOSIS — IMO0002 Reserved for concepts with insufficient information to code with codable children: Secondary | ICD-10-CM | POA: Diagnosis present

## 2014-12-28 LAB — TYPE AND SCREEN
ABO/RH(D): A POS
Antibody Screen: NEGATIVE

## 2014-12-28 LAB — CBC
HEMATOCRIT: 32.6 % — AB (ref 36.0–46.0)
Hemoglobin: 10.5 g/dL — ABNORMAL LOW (ref 12.0–15.0)
MCH: 26.3 pg (ref 26.0–34.0)
MCHC: 32.2 g/dL (ref 30.0–36.0)
MCV: 81.7 fL (ref 78.0–100.0)
Platelets: 217 10*3/uL (ref 150–400)
RBC: 3.99 MIL/uL (ref 3.87–5.11)
RDW: 13.6 % (ref 11.5–15.5)
WBC: 9.2 10*3/uL (ref 4.0–10.5)

## 2014-12-28 LAB — RPR: RPR Ser Ql: NONREACTIVE

## 2014-12-28 MED ORDER — DIPHENHYDRAMINE HCL 25 MG PO CAPS: 25.0000 mg | ORAL_CAPSULE | Freq: Four times a day (QID) | ORAL | Status: DC | PRN

## 2014-12-28 MED ORDER — MISOPROSTOL 200 MCG PO TABS
50.0000 ug | ORAL_TABLET | ORAL | Status: DC
  Administered 2014-12-28 (×2): 50 ug via ORAL
  Filled 2014-12-28 (×2): qty 0.5

## 2014-12-28 MED ORDER — SIMETHICONE 80 MG PO CHEW: 80.0000 mg | CHEWABLE_TABLET | ORAL | Status: DC | PRN

## 2014-12-28 MED ORDER — PRENATAL MULTIVITAMIN CH
1.0000 | ORAL_TABLET | Freq: Every day | ORAL | Status: DC
  Administered 2014-12-29 – 2014-12-30 (×2): 1 via ORAL
  Filled 2014-12-28 (×2): qty 1

## 2014-12-28 MED ORDER — ONDANSETRON HCL 4 MG PO TABS: 4.0000 mg | ORAL_TABLET | ORAL | Status: DC | PRN

## 2014-12-28 MED ORDER — CITRIC ACID-SODIUM CITRATE 334-500 MG/5ML PO SOLN: 30.0000 mL | ORAL | Status: DC | PRN

## 2014-12-28 MED ORDER — ONDANSETRON HCL 4 MG/2ML IJ SOLN: 4.0000 mg | INTRAMUSCULAR | Status: DC | PRN

## 2014-12-28 MED ORDER — IBUPROFEN 600 MG PO TABS
600.0000 mg | ORAL_TABLET | Freq: Four times a day (QID) | ORAL | Status: DC
  Administered 2014-12-28 – 2014-12-30 (×8): 600 mg via ORAL
  Filled 2014-12-28 (×8): qty 1

## 2014-12-28 MED ORDER — LACTATED RINGERS IV SOLN
INTRAVENOUS | Status: DC
Start: 2014-12-28 — End: 2014-12-28
  Administered 2014-12-28: 08:00:00 via INTRAVENOUS

## 2014-12-28 MED ORDER — BENZOCAINE-MENTHOL 20-0.5 % EX AERO
1.0000 "application " | INHALATION_SPRAY | CUTANEOUS | Status: DC | PRN
  Administered 2014-12-28: 1 via TOPICAL
  Filled 2014-12-28: qty 56

## 2014-12-28 MED ORDER — OXYTOCIN 40 UNITS IN LACTATED RINGERS INFUSION - SIMPLE MED: 62.5000 mL/h | INTRAVENOUS | Status: DC | PRN

## 2014-12-28 MED ORDER — TETANUS-DIPHTH-ACELL PERTUSSIS 5-2.5-18.5 LF-MCG/0.5 IM SUSP: 0.5000 mL | Freq: Once | INTRAMUSCULAR | Status: DC

## 2014-12-28 MED ORDER — LACTATED RINGERS IV SOLN: 500.0000 mL | INTRAVENOUS | Status: DC | PRN

## 2014-12-28 MED ORDER — OXYCODONE-ACETAMINOPHEN 5-325 MG PO TABS: 1.0000 | ORAL_TABLET | ORAL | Status: DC | PRN

## 2014-12-28 MED ORDER — SENNOSIDES-DOCUSATE SODIUM 8.6-50 MG PO TABS
2.0000 | ORAL_TABLET | ORAL | Status: DC
  Administered 2014-12-28 – 2014-12-29 (×2): 2 via ORAL
  Filled 2014-12-28 (×2): qty 2

## 2014-12-28 MED ORDER — FENTANYL CITRATE (PF) 100 MCG/2ML IJ SOLN
100.0000 ug | INTRAMUSCULAR | Status: DC | PRN
  Administered 2014-12-28 (×3): 100 ug via INTRAVENOUS
  Filled 2014-12-28 (×3): qty 2

## 2014-12-28 MED ORDER — WITCH HAZEL-GLYCERIN EX PADS: 1.0000 "application " | MEDICATED_PAD | CUTANEOUS | Status: DC | PRN

## 2014-12-28 MED ORDER — FLEET ENEMA 7-19 GM/118ML RE ENEM: 1.0000 | ENEMA | Freq: Once | RECTAL | Status: DC

## 2014-12-28 MED ORDER — OXYTOCIN BOLUS FROM INFUSION
500.0000 mL | INTRAVENOUS | Status: DC
  Administered 2014-12-28: 500 mL via INTRAVENOUS

## 2014-12-28 MED ORDER — ACETAMINOPHEN 325 MG PO TABS: 650.0000 mg | ORAL_TABLET | ORAL | Status: DC | PRN

## 2014-12-28 MED ORDER — LANOLIN HYDROUS EX OINT: TOPICAL_OINTMENT | CUTANEOUS | Status: DC | PRN

## 2014-12-28 MED ORDER — OXYCODONE-ACETAMINOPHEN 5-325 MG PO TABS: 2.0000 | ORAL_TABLET | ORAL | Status: DC | PRN

## 2014-12-28 MED ORDER — DIBUCAINE 1 % RE OINT: 1.0000 "application " | TOPICAL_OINTMENT | RECTAL | Status: DC | PRN

## 2014-12-28 MED ORDER — LIDOCAINE HCL (PF) 1 % IJ SOLN
30.0000 mL | INTRAMUSCULAR | Status: AC | PRN
  Administered 2014-12-28: 30 mL via SUBCUTANEOUS
  Filled 2014-12-28: qty 30

## 2014-12-28 MED ORDER — OXYTOCIN 40 UNITS IN LACTATED RINGERS INFUSION - SIMPLE MED
62.5000 mL/h | INTRAVENOUS | Status: DC
  Filled 2014-12-28: qty 1000

## 2014-12-28 MED ORDER — ONDANSETRON HCL 4 MG/2ML IJ SOLN: 4.0000 mg | Freq: Four times a day (QID) | INTRAMUSCULAR | Status: DC | PRN

## 2014-12-28 MED ORDER — ZOLPIDEM TARTRATE 5 MG PO TABS: 5.0000 mg | ORAL_TABLET | Freq: Every evening | ORAL | Status: DC | PRN

## 2014-12-28 NOTE — H&P (Signed)
Dawn Benson is a 29 y.o. female presenting for elective induction of labor at term.  Does not desire an epidural, prefers IVP pain medications.   History OB History    Gravida Para Term Preterm AB TAB SAB Ectopic Multiple Living   4 1 1  2  2   1      Past Medical History  Diagnosis Date  . Hypotension   . UTI in pregnancy   . Medical history non-contributory    Past Surgical History  Procedure Laterality Date  . Intrauterine device insertion    . No past surgeries     Family History: family history includes Fibroids in her mother; Hypertension in her father and mother. Social History:  reports that she has never smoked. She has never used smokeless tobacco. She reports that she does not drink alcohol or use illicit drugs.   Prenatal Transfer Tool  Maternal Diabetes: No Genetic Screening: Normal Maternal Ultrasounds/Referrals: Normal Fetal Ultrasounds or other Referrals:  None Maternal Substance Abuse:  No Significant Maternal Medications:  None Significant Maternal Lab Results:  None Other Comments:  None  ROS  Dilation: 2.5 Effacement (%): 70 Station: -2 Exam by:: Orpah Melter, RN Temperature 97.9 F (36.6 C), temperature source Oral, resp. rate 18, height 5' 6.5" (1.689 m), weight 217 lb (98.431 kg), last menstrual period 03/25/2014, not currently breastfeeding.   Exam Physical Exam  Lungs:  CTA bilaterally Cardiac:  S1&S2 present, RRR, no murmurs, gallops or rubs present.  EFW: about 7.5lbs, pelvis seems adequate Prenatal labs: ABO, Rh: A/POS/-- (05/20 1538) Antibody: NEG (05/20 1538) Rubella: 3.54 (05/20 1538) RPR: NON REAC (07/07 1400)  HBsAg: NEGATIVE (05/20 1538)  HIV: NONREACTIVE (07/07 1400)  GBS: NOT DETECTED (08/24 1656)   Assessment/Plan: IOL elective at term with Cytotec PO, IVP pain medications as needed for pain.     Morene Crocker, CNM 12/28/2014, 8:42 AM

## 2014-12-29 LAB — CBC
HEMATOCRIT: 31.8 % — AB (ref 36.0–46.0)
HEMOGLOBIN: 10.3 g/dL — AB (ref 12.0–15.0)
MCH: 26.6 pg (ref 26.0–34.0)
MCHC: 32.4 g/dL (ref 30.0–36.0)
MCV: 82.2 fL (ref 78.0–100.0)
Platelets: 207 10*3/uL (ref 150–400)
RBC: 3.87 MIL/uL (ref 3.87–5.11)
RDW: 13.7 % (ref 11.5–15.5)
WBC: 9.9 10*3/uL (ref 4.0–10.5)

## 2014-12-29 NOTE — Lactation Note (Signed)
This note was copied from the chart of Dawn Bernadette Gores. Lactation Consultation Note; Experienced BF mom nursed first baby for 5 months until she got pregnant with this baby. Reports baby just finished feeding for 10 min. States she is latching well with no pain most of the time. Encouraged to re latch baby if painful altch. Asleep at mom's side at present. BF brochure given reviewed BFSG and OP appointments as resources for support after DC. Asking about thrush- reviewed symptoms and treatment with mom. Requests manual pump given with instructions for use and cleaning. # 27 flange given. No questions at present. To call prn  Patient Name: Dawn Benson [id number] Date: 12/29/2014 Reason for consult: Initial assessment   Maternal Data Formula Feeding for Exclusion: No Does the patient have breastfeeding experience prior to this delivery?: Yes  Feeding    LATCH Score/Interventions                      Lactation Tools Discussed/Used Pump Review: Setup, frequency, and cleaning Initiated by:: DW Date initiated:: 12/29/14   Consult Status Consult Status: PRN    [name] 12/29/2014, 9:08 AM

## 2014-12-29 NOTE — Progress Notes (Signed)
Post Partum Day #1 Subjective: no complaints, up ad lib, voiding and tolerating PO.  Breastfeeding is going well.    Objective: Blood pressure 116/65, pulse 68, temperature 97.7 F (36.5 C), temperature source Oral, resp. rate 18, height 5' 6.5" (1.689 m), weight 217 lb (98.431 kg), last menstrual period 03/25/2014, SpO2 98 %, unknown if currently breastfeeding.  Physical Exam:  General: alert, cooperative and no distress Lochia: appropriate Uterine Fundus: firm Incision: healing well DVT Evaluation: No evidence of DVT seen on physical exam. No cords or calf tenderness. No significant calf/ankle edema.   Recent Labs  12/28/14 0730 12/29/14 0644  HGB 10.5* 10.3*  HCT 32.6* 31.8*    Assessment/Plan: Plan for discharge tomorrow and Breastfeeding   LOS: 1 day   Rachelle A Denney 12/29/2014, 8:12 AM

## 2014-12-30 MED ORDER — SENNOSIDES-DOCUSATE SODIUM 8.6-50 MG PO TABS: 2.0000 | ORAL_TABLET | ORAL | Status: DC

## 2014-12-30 MED ORDER — OXYCODONE-ACETAMINOPHEN 5-325 MG PO TABS: 2.0000 | ORAL_TABLET | ORAL | Status: DC | PRN

## 2014-12-30 MED ORDER — BENZOCAINE-MENTHOL 20-0.5 % EX AERO: 1.0000 "application " | INHALATION_SPRAY | CUTANEOUS | Status: DC | PRN

## 2014-12-30 MED ORDER — IBUPROFEN 800 MG PO TABS: 800.0000 mg | ORAL_TABLET | Freq: Three times a day (TID) | ORAL | Status: DC | PRN

## 2014-12-30 MED ORDER — SIMETHICONE 80 MG PO CHEW: 80.0000 mg | CHEWABLE_TABLET | ORAL | Status: DC | PRN

## 2014-12-30 NOTE — Discharge Summary (Signed)
Obstetric Discharge Summary Reason for Admission: induction of labor Prenatal Procedures: ultrasound Intrapartum Procedures: spontaneous vaginal delivery Postpartum Procedures: none Complications-Operative and Postpartum: 2nd degree perineal laceration HEMOGLOBIN  Date Value Ref Range Status  12/29/2014 10.3* 12.0 - 15.0 g/dL Final   HCT  Date Value Ref Range Status  12/29/2014 31.8* 36.0 - 46.0 % Final    Physical Exam:  General: alert, cooperative and no distress Lochia: appropriate Uterine Fundus: firm Incision: healing well DVT Evaluation: No evidence of DVT seen on physical exam. No cords or calf tenderness. No significant calf/ankle edema.  Discharge Diagnoses: Term Pregnancy-delivered  Discharge Information: Date: 12/30/2014 Activity: pelvic rest Diet: routine Medications: PNV, Ibuprofen, Colace and Percocet Condition: stable Instructions: refer to practice specific booklet Discharge to: home   Newborn Data: Live born female  Birth Weight: 7 lb 7.2 oz (3380 g) APGAR: 8, 9  Home with mother.  Morene Crocker, CNM 12/30/2014, 8:14 AM

## 2014-12-30 NOTE — Progress Notes (Signed)
Post Partum Day #2 Subjective: no complaints, up ad lib, voiding, tolerating PO and + flatus  Objective: Blood pressure 93/49, pulse 49, temperature 97.7 F (36.5 C), temperature source Oral, resp. rate 18, height 5' 6.5" (1.689 m), weight 217 lb (98.431 kg), last menstrual period 03/25/2014, SpO2 98 %, unknown if currently breastfeeding.  Physical Exam:  General: alert, cooperative and no distress Lochia: appropriate Uterine Fundus: firm Incision: healing well DVT Evaluation: No evidence of DVT seen on physical exam. No cords or calf tenderness. No significant calf/ankle edema.   Recent Labs  12/28/14 0730 12/29/14 0644  HGB 10.5* 10.3*  HCT 32.6* 31.8*    Assessment/Plan: Discharge home and Breastfeeding.     LOS: 2 days   Rachelle A Denney 12/30/2014, 8:10 AM

## 2014-12-30 NOTE — Lactation Note (Signed)
This note was copied from the chart of Dawn Palin Tristan. Lactation Consultation Note  Mother's breast are filling.  Denies soreness. Discussed engorgement care and monitoring voids/stools. Encouraged her to call if she needs further assistance.  Patient Name: Dawn Benson [id number] Date: 12/30/2014 Reason for consult: Follow-up assessment   Maternal Data    Feeding Feeding Type: Breast Fed Length of feed: 10 min  LATCH Score/Interventions                      Lactation Tools Discussed/Used     Consult Status Consult Status: Complete    Carlye Grippe 12/30/2014, 9:18 AM

## 2014-12-30 NOTE — Discharge Instructions (Signed)
Please call [facility] to schedule your 2 week postpartum visit.  Postpartum Care After Vaginal Delivery After you deliver your baby, you will stay in the hospital for 24 to 72 hours, unless there were problems with the labor or delivery, or you have medical problems. While you are in the hospital, you will receive help and instructions on how to care for yourself and your baby. Your doctor will order pain medicine, in case you need it. You will have a small amount of bleeding from your vagina and should change your sanitary pad frequently. Wash your hands thoroughly with soap and water for at least 20 seconds after changing pads and using the toilet. Let the nurses know if you begin to pass blood clots or your bleeding increases. Do not flush blood clots down the toilet before having the nurse look at them, to make sure there is no placental tissue with them. If you had an intravenous, it will be removed within 24 hours, if there are no problems. The first time you get out of bed or take a shower, call the nurse to help you because you may get weak, lightheaded, or even faint. If you are breastfeeding, you may feel painful contractions of your uterus for a couple of weeks. This is normal. The contractions help your uterus get back to normal size. If you are not breastfeeding, wear a tight, binding bra and decrease your fluid intake. You may be given a medicine to dry up the milk in your breasts. Hormones should not be given to dry up the breasts, because they can cause blood clots. You will be given your normal diet, unless you have diabetes or other medical problems.  The nurses may put an ice pack on your episiotomy (surgically enlarged opening), if you have one, to reduce the pain and puffiness (swelling). On rare occasions, you may not be able to urinate and the nurse will need to empty your bladder with a catheter. If you had a postpartum tubal ligation ("tying tubes," female sterilization),  it should not make your stay in the hospital longer. You may have your baby in your room with you as much as you like, unless you or the baby has a problem. Use the bassinet (basket) for the baby when going to and from the nursery. Do not carry the baby. Do not leave the postpartum area. If the mother is Rh negative (lacks a protein on the red blood cells) and the baby is Rh positive, the mother should get a Rho-gam shot to prevent Rh problems with future pregnancies. You may be given written instructions for you and your baby, and necessary medicines, when you are discharged from the hospital. Be sure you understand and follow the instructions as advised. HOME CARE INSTRUCTIONS AFTER YOUR DELIVERY:  Follow instructions and take the medicines given to you.   Only take over-the-counter or prescription medicines for pain, discomfort, or fever as directed by your caregiver.   Do not take aspirin, because it can cause bleeding.   Increase your activities a little bit every day to build up your strength and endurance.   Do not drink alcohol, especially if you are breastfeeding or taking pain medicine.   Take your temperature twice a day and record it.   You may have a small amount of bleeding or spotting for 2 to 4 weeks. This is normal.   Do not use tampons or douche. Use sanitary pads.   Try to have someone stay  and help you for a few days when you go home.   Try to rest or take a nap when the baby is sleeping.   If you are breastfeeding, wear a good support bra. If you are not breastfeeding, wear a tight bra, do not stimulate your nipples, and decrease your fluid intake.   Eat a healthy, nutritious diet and continue to take your prenatal vitamins.   Do not drive, do any heavy activities or travel until your caregiver tells you it is OK.   Do not have intercourse until your caregiver gives you permission to do so.   Ask your caregiver when you can begin to exercise and what type of  exercises to do.   Call your caregiver if you think you are having a problem from your delivery.   Call your pediatrician if you are having a problem with the baby.   Schedule your postpartum visit and keep it.  SEEK MEDICAL CARE IF:  You have a temperature of 100 F (37.8 C) or higher.   You have increased vaginal bleeding or are passing clots. Save any clots to show your caregiver.   You have bloody urine, or pain when you urinate.   You have a bad smelling vaginal discharge.   You have increasing pain or swelling on your episiotomy (surgically enlarged opening).   You develop a severe headache.   You feel depressed.   The episiotomy is separating.   You become dizzy or lightheaded.   You develop a rash.   You have a reaction or problems with your medicine.   You have pain, redness, and/or swelling at the intravenous site.  SEEK IMMEDIATE MEDICAL CARE IF:  You have chest pain.   You develop shortness of breath.   You pass out.   You develop pain, with or without swelling or redness in your leg.   You develop heavy vaginal bleeding, with or without blood clots.   You develop stomach pain.   You develop a bad smelling vaginal discharge.  MAKE SURE YOU:   Understand these instructions.   Will watch your condition.   Will get help right away if you are not doing well or get worse.  Document Released: 01/20/2007 Document Re-Released: 03/13/2009 [facility] Patient Information 2011 [place].

## 2015-01-24 ENCOUNTER — Ambulatory Visit (INDEPENDENT_AMBULATORY_CARE_PROVIDER_SITE_OTHER): Payer: Medicaid Other | Admitting: Certified Nurse Midwife

## 2015-01-24 ENCOUNTER — Encounter: Payer: Self-pay | Admitting: Certified Nurse Midwife

## 2015-01-24 ENCOUNTER — Ambulatory Visit: Payer: Medicaid Other | Admitting: Obstetrics

## 2015-01-24 MED ORDER — NORETHINDRONE 0.35 MG PO TABS: 1.0000 | ORAL_TABLET | Freq: Every day | ORAL | Status: AC

## 2015-01-25 NOTE — Progress Notes (Signed)
Patient ID: Dawn Benson, female   DOB: 16-Oct-1985, 29 y.o.   MRN: [id number]  Subjective:     Dawn Benson is a 29 y.o. female who presents for a postpartum visit. She is 2 weeks postpartum following a spontaneous vaginal delivery. I have fully reviewed the prenatal and intrapartum course. The delivery was at 22 gestational weeks. Outcome: spontaneous vaginal delivery. Anesthesia: epidural. Postpartum course has been normal. Baby's course has been normal. Baby is feeding by breast. Bleeding thin lochia and pink. Bowel function is normal. Bladder function is normal. Patient is not sexually active. Contraception method is abstinence. Postpartum depression screening: negative.  Tobacco, alcohol and substance abuse history reviewed.  Adult immunizations reviewed including TDAP, rubella and varicella. States she is starting a new job in two weeks.   The following portions of the patient's history were reviewed and updated as appropriate: allergies, current medications, past family history, past medical history, past social history, past surgical history and problem list.  Review of Systems Pertinent items noted in HPI and remainder of comprehensive ROS otherwise negative.   Objective:    BP 113/74 mmHg  Pulse 75  Temp(Src) 98.3 F (36.8 C)  Ht 5' 6.5" (1.689 m)  Wt 206 lb (93.441 kg)  BMI 32.76 kg/m2  Breastfeeding? Yes  General:  alert, cooperative, fatigued and no distress   Breasts:  inspection negative, no nipple discharge or bleeding, no masses or nodularity palpable  Lungs: clear to auscultation bilaterally  Heart:  regular rate and rhythm, S1, S2 normal, no murmur, click, rub or gallop  Abdomen: soft, non-tender; bowel sounds normal; no masses,  no organomegaly   Vulva:  not evaluated  Vagina: not evaluated  Cervix:  not evaluated  Corpus: not examined  Adnexa:  not evaluated  Rectal Exam: Not performed.          50% of 25 min visit spent on counseling and coordination of care.   Assessment:     Normal 2 week postpartum exam. Pap smear not done at today's visit.  Plan:    1. Contraception: oral progesterone-only contraceptive 2. Pap smear with 6 week postpartum visit 3. Follow up in: 4 weeks or as needed.  2hr GTT for h/o GDM/screening for DM q 3 yrs per ADA recommendations Preconception counseling provided Healthy lifestyle practices reviewed

## 2015-02-21 ENCOUNTER — Ambulatory Visit: Payer: Medicaid Other | Admitting: Certified Nurse Midwife

## 2015-02-21 ENCOUNTER — Ambulatory Visit: Payer: Medicaid Other | Admitting: Obstetrics

## 2015-03-15 ENCOUNTER — Encounter: Payer: Self-pay | Admitting: Obstetrics

## 2015-03-15 ENCOUNTER — Ambulatory Visit (INDEPENDENT_AMBULATORY_CARE_PROVIDER_SITE_OTHER): Payer: Self-pay | Admitting: Obstetrics

## 2015-03-15 DIAGNOSIS — N390 Urinary tract infection, site not specified: Secondary | ICD-10-CM

## 2015-03-15 DIAGNOSIS — Z3041 Encounter for surveillance of contraceptive pills: Secondary | ICD-10-CM

## 2015-03-15 LAB — POCT URINALYSIS DIPSTICK
Bilirubin, UA: NEGATIVE
Glucose, UA: NEGATIVE
KETONES UA: NEGATIVE
Nitrite, UA: NEGATIVE
RBC UA: 250
SPEC GRAV UA: 1.01
UROBILINOGEN UA: NEGATIVE
pH, UA: 6

## 2015-03-15 MED ORDER — CEFUROXIME AXETIL 500 MG PO TABS: 500.0000 mg | ORAL_TABLET | Freq: Two times a day (BID) | ORAL | Status: AC

## 2015-03-15 NOTE — Progress Notes (Signed)
Subjective:     Dawn Benson is a 29 y.o. female who presents for a postpartum visit. She is 6 weeks postpartum following a spontaneous vaginal delivery. I have fully reviewed the prenatal and intrapartum course. The delivery was at 5 gestational weeks. Outcome: spontaneous vaginal delivery. Anesthesia: epidural. Postpartum course has been normal. Baby's course has been normal. Baby is feeding by breast. Bleeding thin lochia. Bowel function is normal. Bladder function is normal. Patient is not sexually active. Contraception method is oral progesterone-only contraceptive. Postpartum depression screening: negative.  Tobacco, alcohol and substance abuse history reviewed.  Adult immunizations reviewed including TDAP, rubella and varicella.  The following portions of the patient's history were reviewed and updated as appropriate: allergies, current medications, past family history, past medical history, past social history, past surgical history and problem list.  Review of Systems A comprehensive review of systems was negative.   Objective:    BP 125/77 mmHg  Pulse 71  Temp(Src) 98.9 F (37.2 C)  Ht 5\' 7"  (1.702 m)  Wt 207 lb (93.895 kg)  BMI 32.41 kg/m2  LMP 02/22/2015  Breastfeeding? Yes  General:  alert and no distress   Breasts:  inspection negative, no nipple discharge or bleeding, no masses or nodularity palpable  Lungs: clear to auscultation bilaterally  Heart:  regular rate and rhythm, S1, S2 normal, no murmur, click, rub or gallop  Abdomen: normal findings: soft, non-tender   Vulva:  normal  Vagina: normal vagina  Cervix:  no cervical motion tenderness  Corpus: normal size, contour, position, consistency, mobility, non-tender  Adnexa:  no mass, fullness, tenderness  Rectal Exam: Not performed.           Assessment:     Normal postpartum exam. Pap smear not done at today's visit.  Plan:    1. Contraception: oral progesterone-only contraceptive 2. Continue PNV's 3.  Follow up in: several months or as needed.   Healthy lifestyle practices reviewed

## 2015-03-16 LAB — URINE CULTURE

## 2016-04-27 IMAGING — US US OB COMP LESS 14 WK
1 series · 14 of 22 positions shown · non-contrast
Comparison: None.

CLINICAL DATA: . Heavy vaginal bleeding. Estimated gestational age
by last menstrual period equals 13 weeks 0 days.

EXAM:
OBSTETRIC <14 WK ULTRASOUND
TECHNIQUE: Transabdominal ultrasound was performed for evaluation of the
gestation as well as the maternal uterus and adnexal regions.

[Series 1: us ob comp less 14 wks · 14 of 22 slices shown]
[im 1/22]
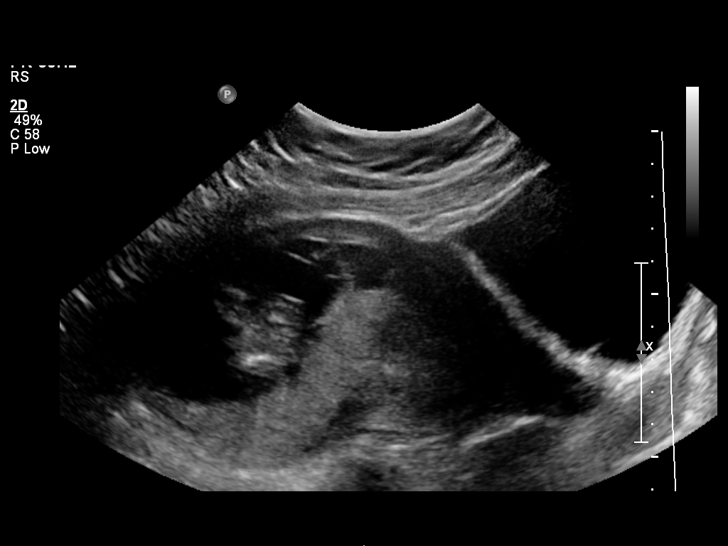
[im 3/22]
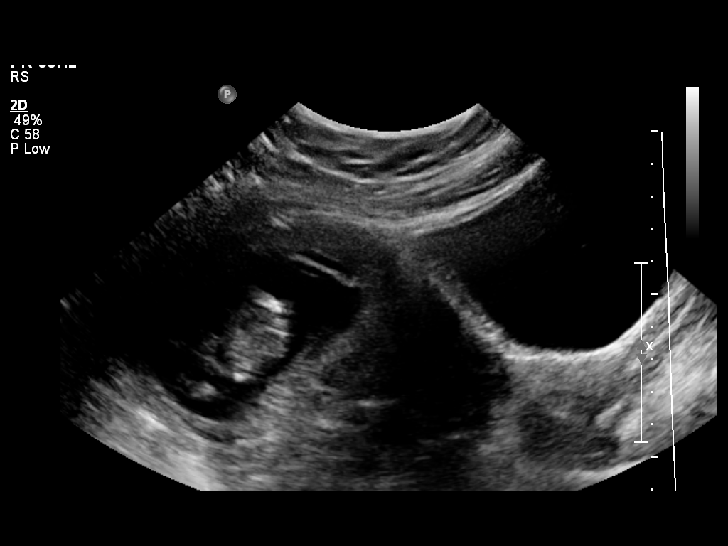
[im 4/22]
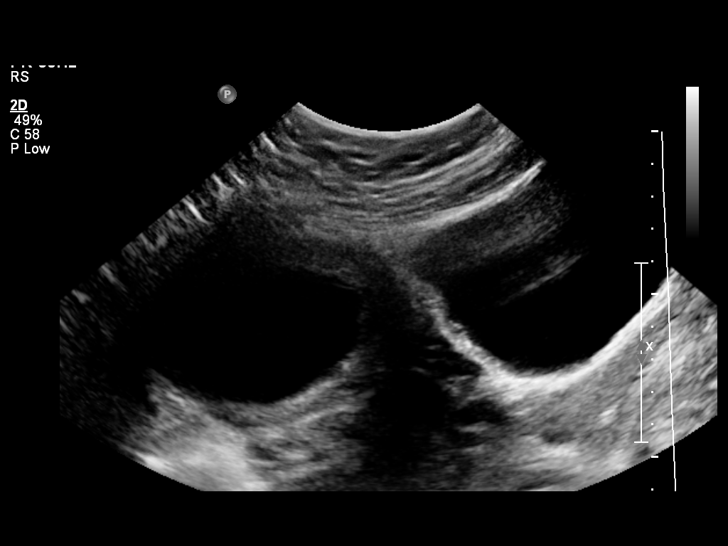
[im 6/22]
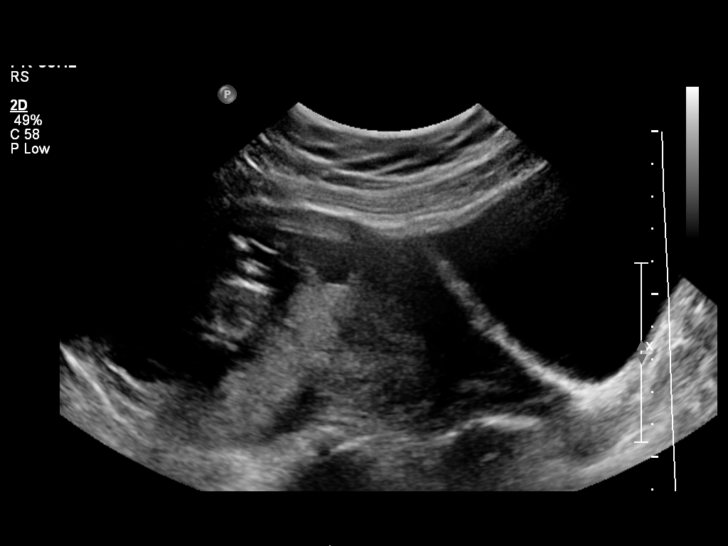
[im 8/22]
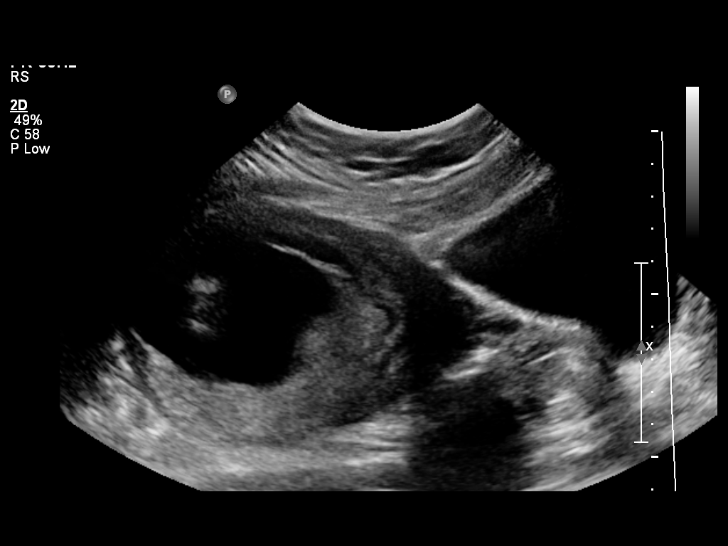
[im 9/22]
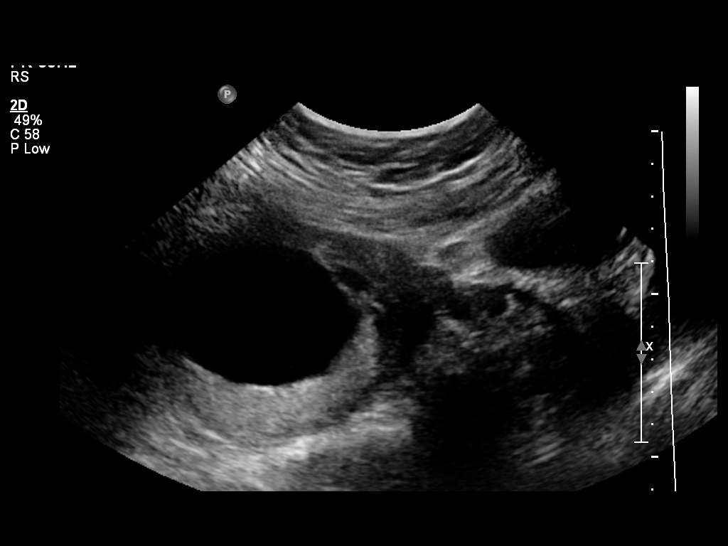
[im 11/22]
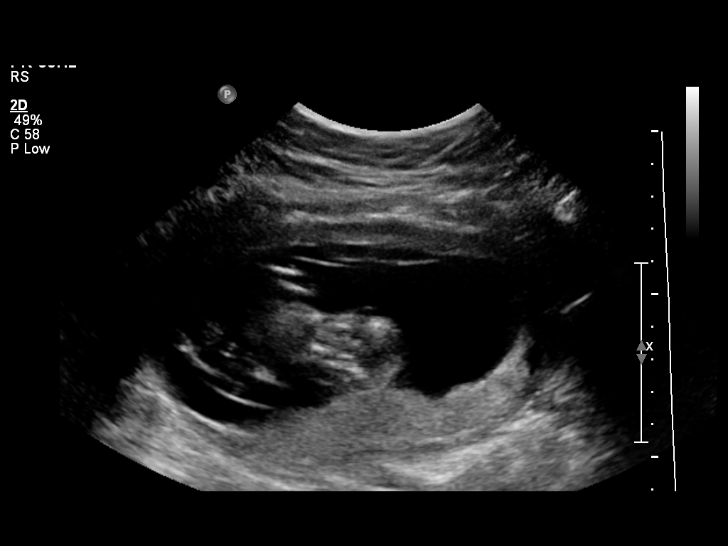
[im 12/22]
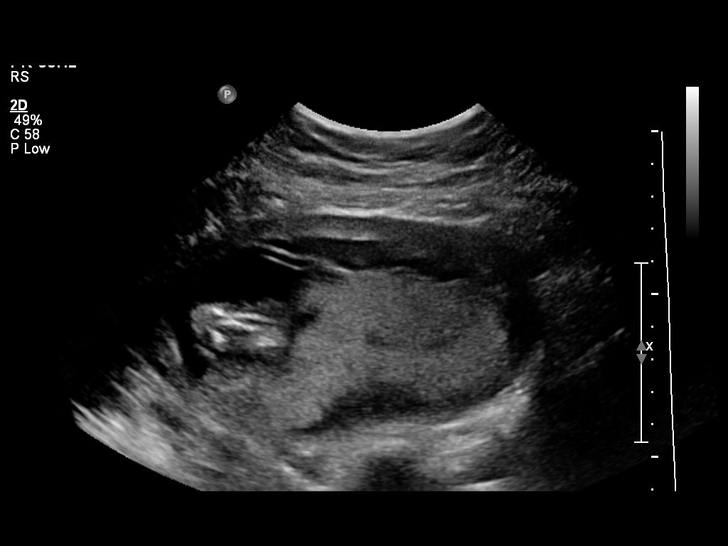
[im 14/22]
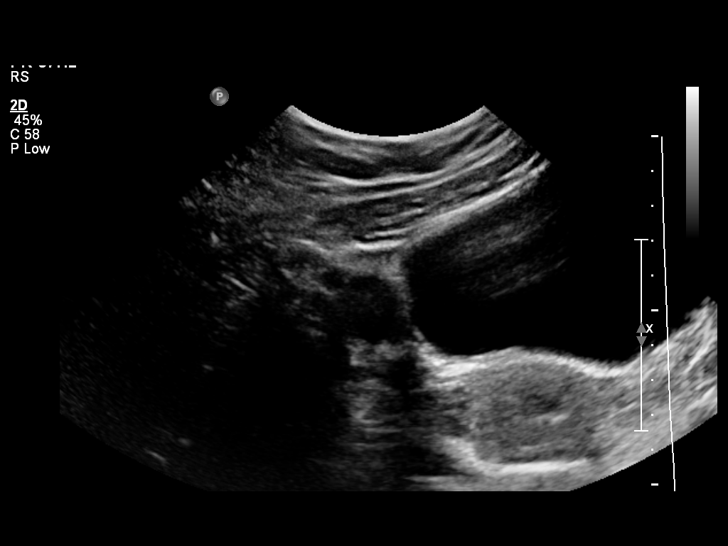
[im 15/22]
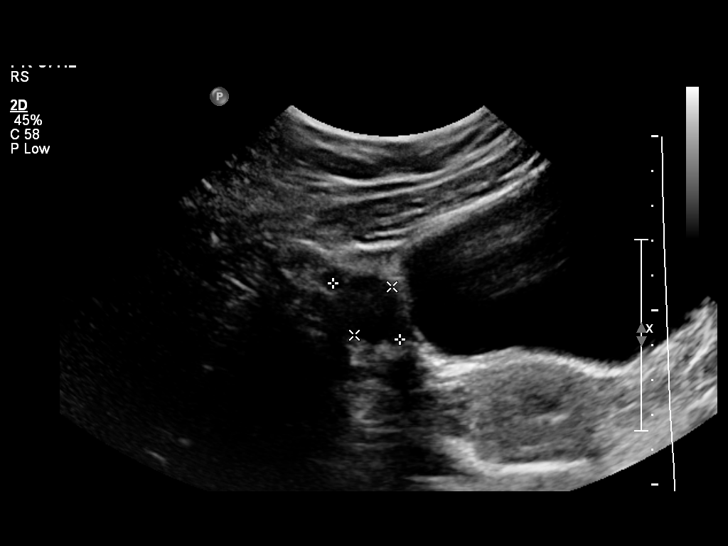
[im 17/22]
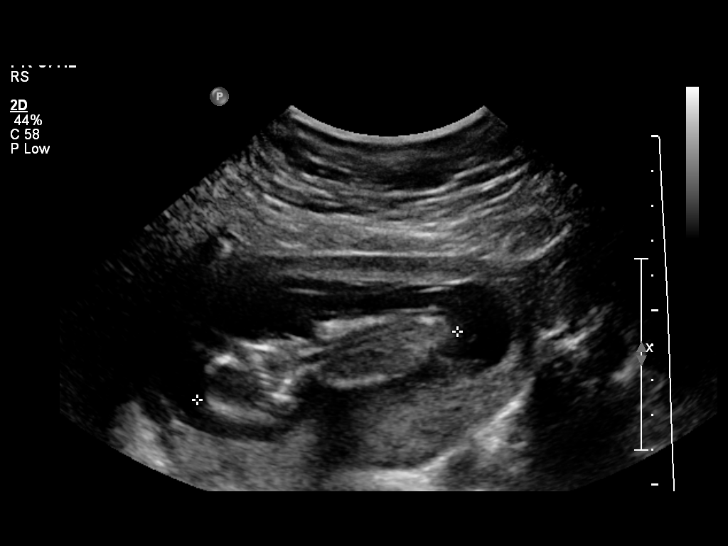
[im 19/22]
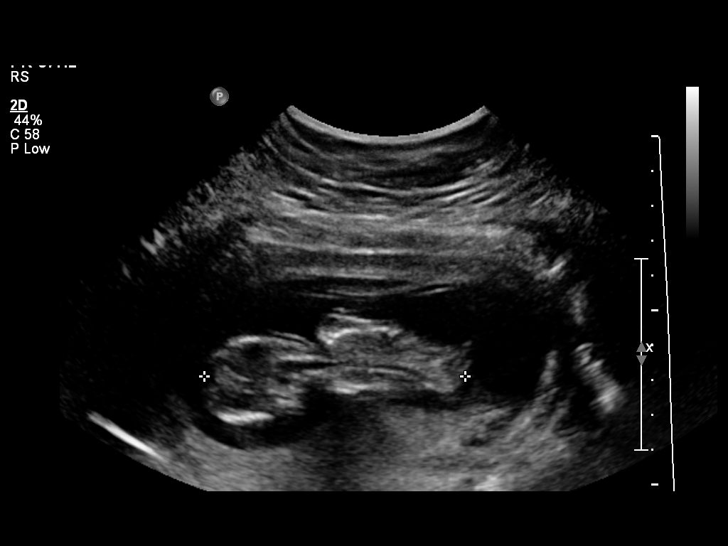
[im 20/22]
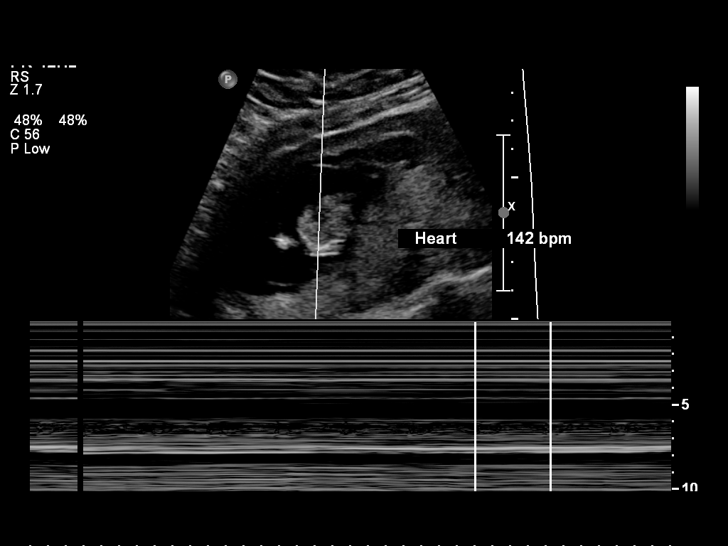
[im 22/22]
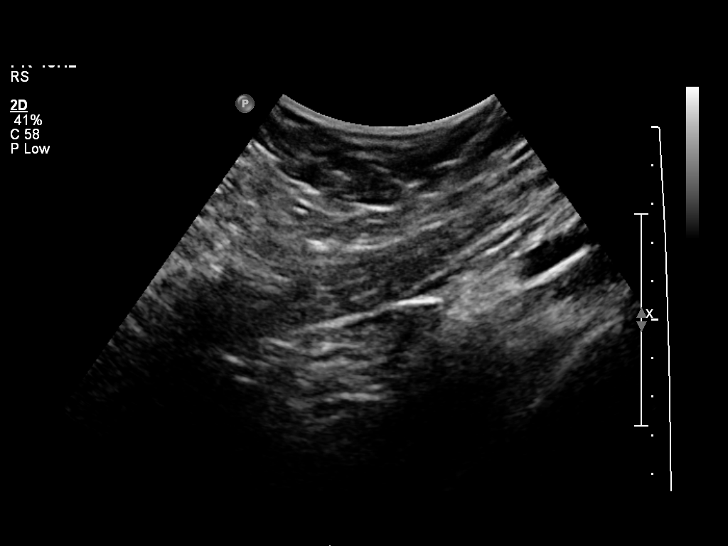

[14 of 22 positions shown; findings below may reference images not displayed]

FINDINGS: Intrauterine gestational sac: Single

Yolk sac:  Not identified

Embryo:  Present

Cardiac Activity: Present

Heart Rate: 142 bpm

CRL:   74  mm   13 w 4 d                  US EDC: 12/26/2014

Maternal uterus/adnexae: Normal right ovary. Left ovary not
identified. No free fluid.
IMPRESSION: 1. Single intrauterine gestation with embryo and normal cardiac
activity.

2. Estimated gestational age by crown rump length equals 13 weeks 4
days.

## 2016-07-02 IMAGING — US US OB COMP +14 WK
1 series · 12 of 28 positions shown · non-contrast
Comparison: none

[Series 1: us ob +14 all · 66 acquisitions, 12 frames shown]
[im 3/66]
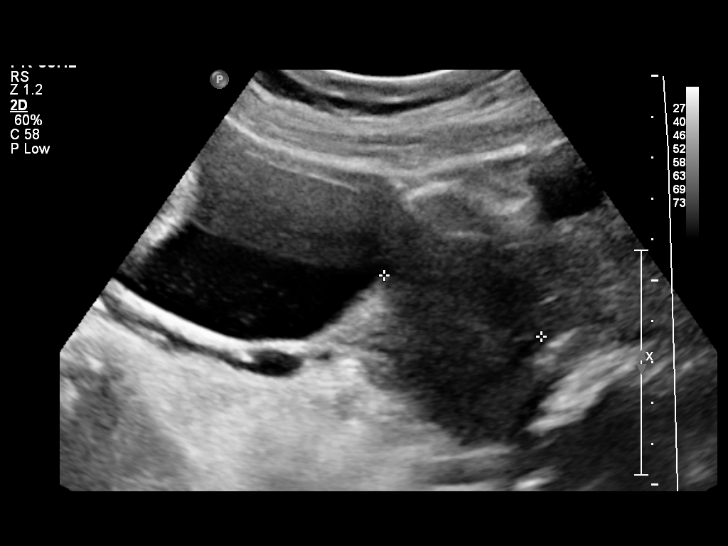
[im 8/66]
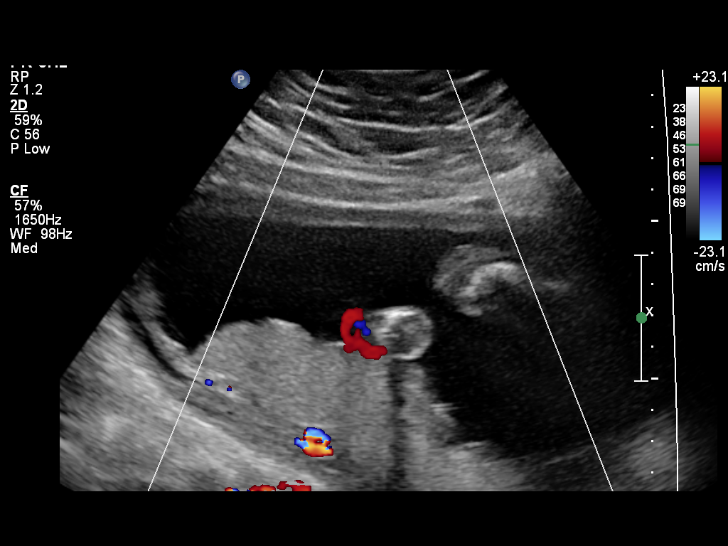
[im 13/66]
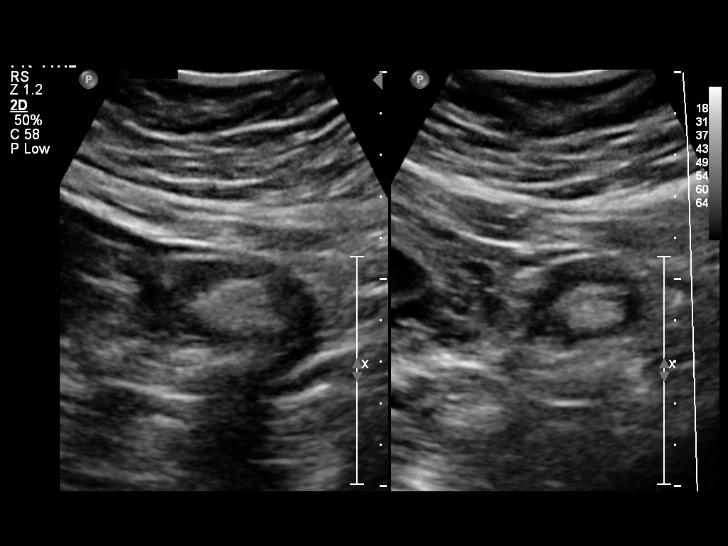
[im 20/66]
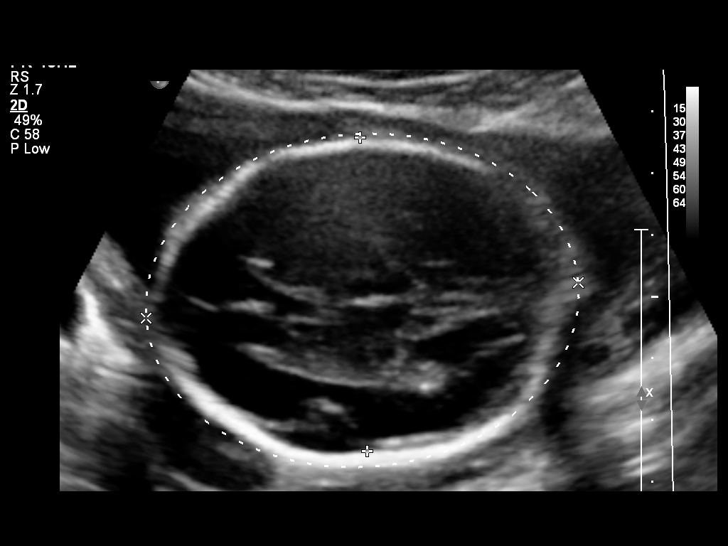
[im 25/66]
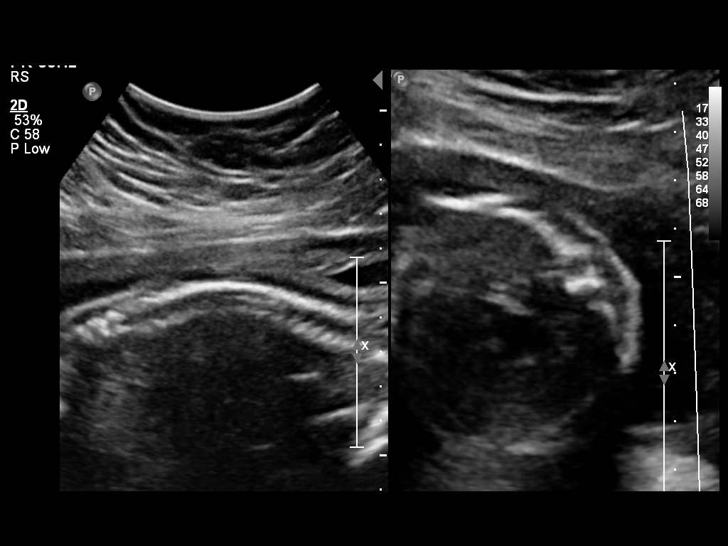
[im 29/66]
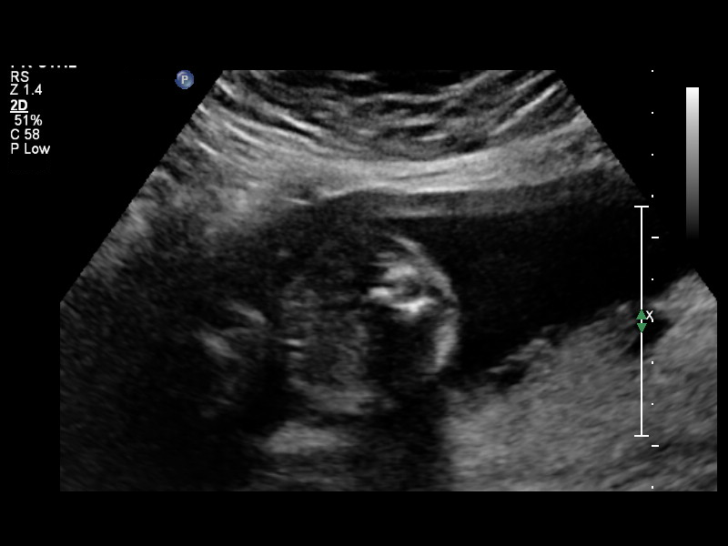
[im 37/66]
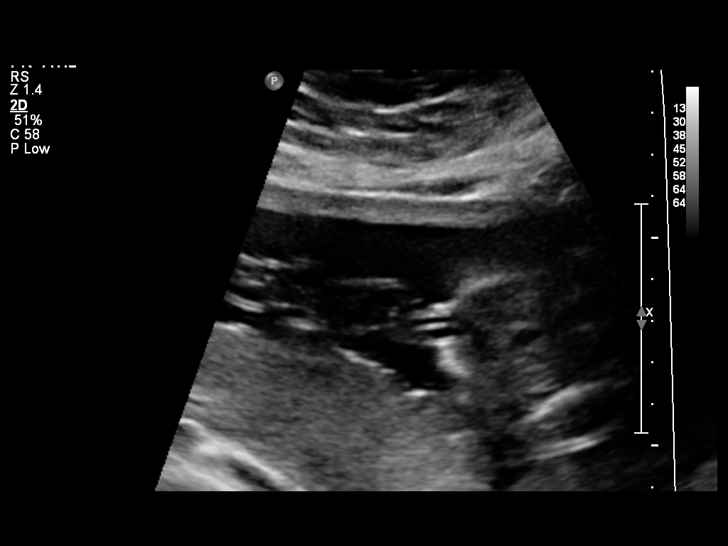
[im 41/66]
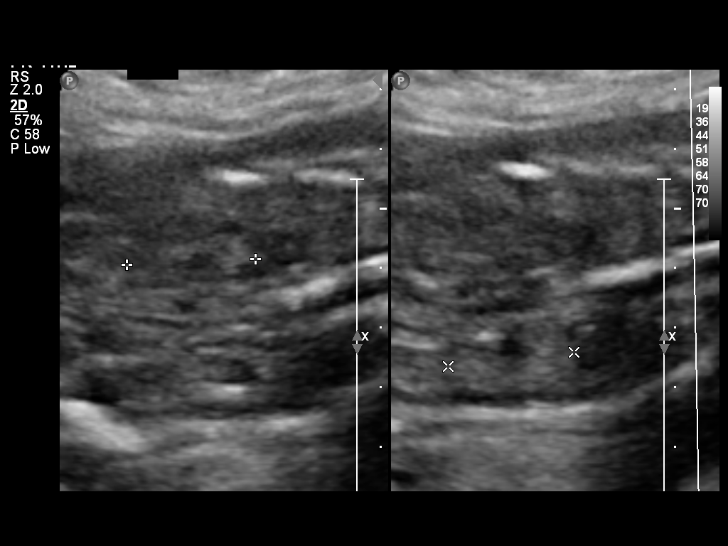
[im 46/66]
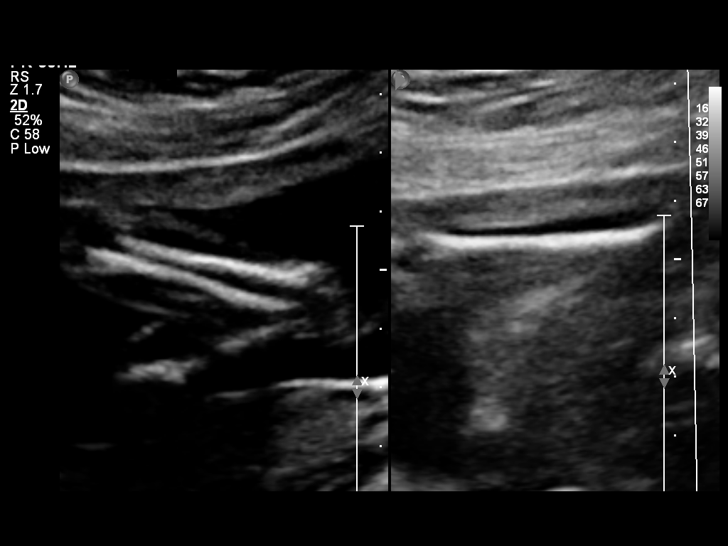
[im 53/66]
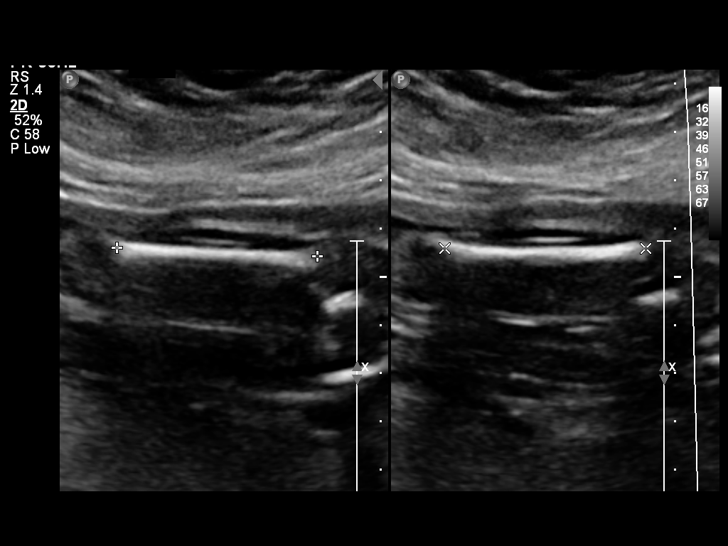
[im 58/66]
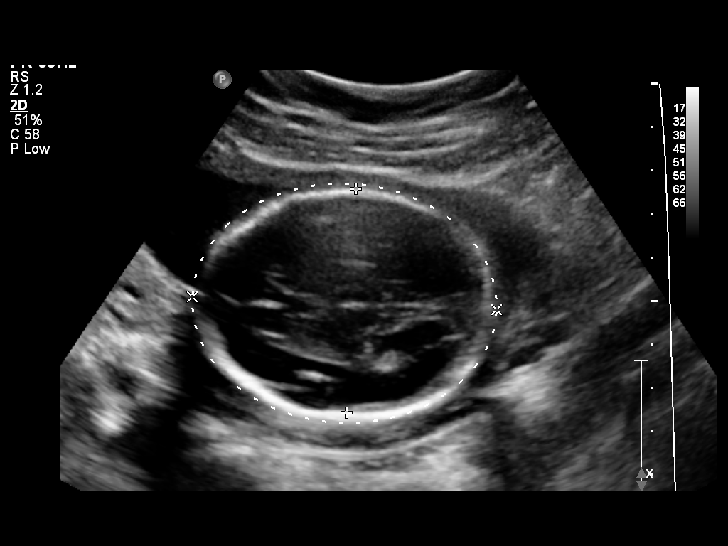
[im 63/66]
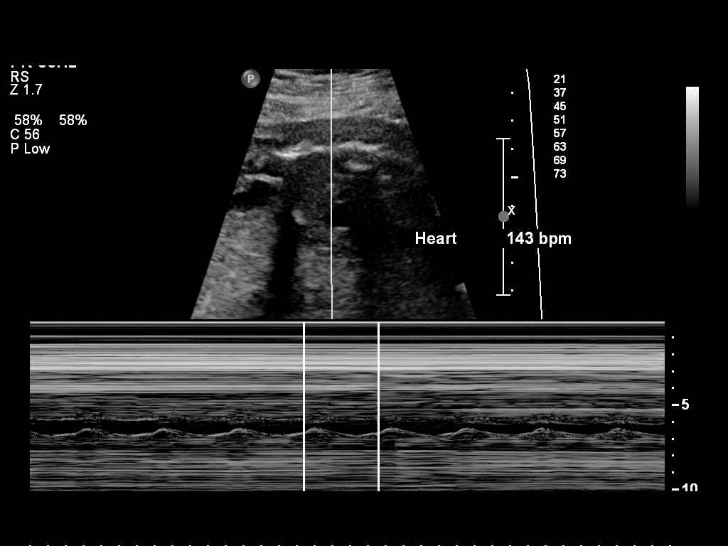

[12 of 28 positions shown; findings below may reference images not displayed]

OBSTETRICS REPORT
(Signed Final 08/29/2014 [DATE])

Service(s) Provided

US OB COMP + 14 WK                                    76805.1
Indications

22 weeks gestation of pregnancy
Basic anatomic survey                                 [id number]
No or Little Prenatal Care
Fetal Evaluation

Num Of Fetuses:    1
Fetal Heart Rate:  143                          bpm
Cardiac Activity:  Observed
Presentation:      Cephalic
Placenta:          Posterior, above cervical
os
P. Cord            Visualized, central
Insertion:

Amniotic Fluid
AFI FV:      Subjectively within normal limits
Larg Pckt:     5.0  cm
Biometry

BPD:       51  mm     G. Age:  21w 4d                CI:        68.77   70 - 86
FL/HC:      21.0   18.4 -
20.2
HC:     196.5  mm     G. Age:  21w 6d       17  %    HC/AC:      1.10   1.06 -
1.25
AC:     177.9  mm     G. Age:  22w 5d       50  %    FL/BPD:     80.8   71 - 87
FL:      41.2  mm     G. Age:  23w 3d       70  %    FL/AC:      23.2   20 - 24
HUM:     37.4  mm     G. Age:  23w 1d       62  %

Est. FW:     536  gm      1 lb 3 oz     56  %
Gestational Age

LMP:           22w 3d        Date:  03/25/14                 EDD:   12/30/14
U/S Today:     22w 3d                                        EDD:   12/30/14
Best:          22w 3d     Det. By:  LMP  (03/25/14)          EDD:   12/30/14
Anatomy
Cranium:          Appears normal         Aortic Arch:      Appears normal
Fetal Cavum:      Appears normal         Ductal Arch:      Not well visualized
Ventricles:       Appears normal         Diaphragm:        Appears normal
Choroid Plexus:   Appears normal         Stomach:          Appears normal, left
sided
Cerebellum:       Appears normal         Abdomen:          Appears normal
Posterior Fossa:  Appears normal         Abdominal Wall:   Appears nml (cord
insert, abd wall)
Nuchal Fold:      Not applicable (>20    Cord Vessels:     Appears normal (3
wks GA)                                  vessel cord)
Face:             Orbits nl; profile not Kidneys:          Appear normal
well visualized
Lips:             Appears normal         Bladder:          Appears normal
Heart:            Not well visualized    Spine:            Appears normal
RVOT:             Not well visualized    Lower             Appears normal
Extremities:
LVOT:             Appears normal         Upper             Appears normal
Extremities:

Other:  Fetus appears to be a female. Heels visualized. Technically difficult
due to  maternal habitus.
Cervix Uterus Adnexa

Cervical Length:    4.1      cm

Cervix:       Normal appearance by transabdominal scan.
Uterus:       No abnormality visualized.
Cul De Sac:   No free fluid seen.

Left Ovary:    Dermoid measuring 2.0 x 1.7 x 1.3 cm.
Right Ovary:   Within normal limits.

Adnexa:     No abnormality visualized.
Impression

Single IUP at 22w 3d
Limited views of the fetal heart obtrained
The remainder of the fetal anatomy appears normal
Ultrasound measurements consistent with dates
Posterior placenta without previa
Normal amniotic fluid volume

A 2 x 1.7 x 1.3 cm calcified mass is noted in the left ovary -
likely dermoid cyst
Recommendations

Recommend follow-up ultrasound examination in 4 weeks to
reevaluate the fetal heart anatomy

questions or concerns.

## 2019-07-02 ENCOUNTER — Ambulatory Visit: Payer: Self-pay | Attending: Internal Medicine

## 2019-07-02 DIAGNOSIS — Z23 Encounter for immunization: Secondary | ICD-10-CM

## 2019-07-02 NOTE — Progress Notes (Signed)
   Covid-19 Vaccination Clinic  Name:  Dawn Benson    MRN: [id number] DOB: 1985-04-24  07/02/2019  [name] was observed post Covid-19 immunization for 15 minutes without incident. She was provided with Vaccine Information Sheet and instruction to access the V-Safe system.   [name] was instructed to call 911 with any severe reactions post vaccine: Marland Kitchen Difficulty breathing  . Swelling of face and throat  . A fast heartbeat  . A bad rash all over body  . Dizziness and weakness   Immunizations Administered    Name Date Dose VIS Date Route   Pfizer COVID-19 Vaccine 07/02/2019  9:42 AM 0.3 mL 03/19/2019 Intramuscular   Manufacturer: [employer]   Lot: [id number]   [place]: [id number]

## 2019-07-28 ENCOUNTER — Ambulatory Visit: Payer: Self-pay

## 2019-07-31 ENCOUNTER — Ambulatory Visit: Payer: Self-pay | Attending: Internal Medicine

## 2019-07-31 DIAGNOSIS — Z23 Encounter for immunization: Secondary | ICD-10-CM

## 2019-07-31 NOTE — Progress Notes (Signed)
   [facility] Vaccination [facility]  Name:  Dawn Benson    MRN: [id number] DOB: 05/19/1985  07/31/2019  [name] was observed post Covid-19 immunization for 15 minutes without incident. She was provided with Vaccine Information Sheet and instruction to access the V-Safe system.   [name] was instructed to call 911 with any severe reactions post vaccine: Marland Kitchen Difficulty breathing  . Swelling of face and throat  . A fast heartbeat  . A bad rash all over body  . Dizziness and weakness   Immunizations Administered    Name Date Dose VIS Date Route   Pfizer COVID-19 Vaccine 07/31/2019  1:56 PM 0.3 mL 06/02/2018 Intramuscular   Manufacturer: [place]   Lot: [id number]   [place]: [id number]
